# Patient Record
Sex: Female | Born: 1975 | Race: White | Hispanic: No | Marital: Single | Smoking: Current every day smoker
Health system: Southern US, Community
[De-identification: ages and names within clinical notes are randomized; demographics above are authoritative.]

## PROBLEM LIST (undated history)

## (undated) DIAGNOSIS — B977 Papillomavirus as the cause of diseases classified elsewhere: Secondary | ICD-10-CM

## (undated) DIAGNOSIS — C801 Malignant (primary) neoplasm, unspecified: Secondary | ICD-10-CM

## (undated) DIAGNOSIS — Z8614 Personal history of Methicillin resistant Staphylococcus aureus infection: Secondary | ICD-10-CM

## (undated) DIAGNOSIS — K635 Polyp of colon: Secondary | ICD-10-CM

## (undated) HISTORY — DX: Polyp of colon: K63.5

## (undated) HISTORY — DX: Papillomavirus as the cause of diseases classified elsewhere: B97.7

## (undated) HISTORY — DX: Personal history of Methicillin resistant Staphylococcus aureus infection: Z86.14

---

## 1995-12-26 DIAGNOSIS — C801 Malignant (primary) neoplasm, unspecified: Secondary | ICD-10-CM

## 1995-12-26 HISTORY — PX: BLADDER SURGERY: SHX569

## 1995-12-26 HISTORY — DX: Malignant (primary) neoplasm, unspecified: C80.1

## 1999-12-26 HISTORY — PX: RECTAL SURGERY: SHX760

## 1999-12-26 HISTORY — PX: ABDOMINAL HYSTERECTOMY: SHX81

## 2005-12-25 HISTORY — PX: GASTRIC BYPASS: SHX52

## 2007-12-26 DIAGNOSIS — K635 Polyp of colon: Secondary | ICD-10-CM

## 2007-12-26 HISTORY — PX: COLONOSCOPY: SHX174

## 2007-12-26 HISTORY — DX: Polyp of colon: K63.5

## 2008-05-04 ENCOUNTER — Emergency Department: Payer: Self-pay | Admitting: Emergency Medicine

## 2008-05-04 ENCOUNTER — Other Ambulatory Visit: Payer: Self-pay

## 2008-05-18 ENCOUNTER — Emergency Department: Payer: Self-pay | Admitting: Emergency Medicine

## 2008-12-22 ENCOUNTER — Emergency Department: Payer: Self-pay | Admitting: Emergency Medicine

## 2009-02-04 ENCOUNTER — Emergency Department: Payer: Self-pay | Admitting: Emergency Medicine

## 2009-11-23 ENCOUNTER — Emergency Department: Payer: Self-pay | Admitting: Internal Medicine

## 2010-02-04 ENCOUNTER — Emergency Department: Payer: Self-pay | Admitting: Emergency Medicine

## 2010-03-08 ENCOUNTER — Emergency Department: Payer: Self-pay | Admitting: Emergency Medicine

## 2010-07-20 ENCOUNTER — Emergency Department: Payer: Self-pay | Admitting: Emergency Medicine

## 2010-07-31 ENCOUNTER — Emergency Department: Payer: Self-pay | Admitting: Emergency Medicine

## 2010-08-11 ENCOUNTER — Emergency Department: Payer: Self-pay | Admitting: Internal Medicine

## 2010-08-14 ENCOUNTER — Inpatient Hospital Stay: Payer: Self-pay | Admitting: Internal Medicine

## 2011-03-09 ENCOUNTER — Emergency Department: Payer: Self-pay

## 2011-03-09 ENCOUNTER — Emergency Department: Payer: Self-pay | Admitting: Emergency Medicine

## 2011-03-10 ENCOUNTER — Emergency Department: Payer: Self-pay | Admitting: Emergency Medicine

## 2011-03-22 ENCOUNTER — Emergency Department: Payer: Self-pay | Admitting: Emergency Medicine

## 2011-09-03 ENCOUNTER — Emergency Department: Payer: Self-pay | Admitting: Emergency Medicine

## 2011-11-15 ENCOUNTER — Emergency Department: Payer: Self-pay | Admitting: Emergency Medicine

## 2012-04-04 ENCOUNTER — Emergency Department: Payer: Self-pay | Admitting: Internal Medicine

## 2012-07-01 ENCOUNTER — Emergency Department: Payer: Self-pay | Admitting: Emergency Medicine

## 2013-06-09 ENCOUNTER — Emergency Department: Payer: Self-pay | Admitting: Emergency Medicine

## 2014-11-07 ENCOUNTER — Emergency Department: Payer: Self-pay | Admitting: Emergency Medicine

## 2014-11-08 LAB — BASIC METABOLIC PANEL
Anion Gap: 9 (ref 7–16)
BUN: 4 mg/dL — ABNORMAL LOW (ref 7–18)
CO2: 26 mmol/L (ref 21–32)
Calcium, Total: 7.9 mg/dL — ABNORMAL LOW (ref 8.5–10.1)
Chloride: 108 mmol/L — ABNORMAL HIGH (ref 98–107)
Creatinine: 0.61 mg/dL (ref 0.60–1.30)
EGFR (African American): >60
Glucose: 84 mg/dL (ref 65–99)
OSMOLALITY: 281 (ref 275–301)
POTASSIUM: 3.5 mmol/L (ref 3.5–5.1)
SODIUM: 143 mmol/L (ref 136–145)

## 2015-01-26 ENCOUNTER — Emergency Department: Payer: Self-pay | Admitting: Emergency Medicine

## 2015-06-19 ENCOUNTER — Encounter: Payer: Self-pay | Admitting: Emergency Medicine

## 2015-06-19 ENCOUNTER — Emergency Department
Admission: EM | Admit: 2015-06-19 | Discharge: 2015-06-19 | Disposition: A | Discharge status: Home / Self Care | Payer: Self-pay | Attending: Emergency Medicine | Admitting: Emergency Medicine

## 2015-06-19 ENCOUNTER — Emergency Department: Payer: Self-pay

## 2015-06-19 DIAGNOSIS — S60211A Contusion of right wrist, initial encounter: Secondary | ICD-10-CM | POA: Insufficient documentation

## 2015-06-19 DIAGNOSIS — Z720 Tobacco use: Secondary | ICD-10-CM | POA: Insufficient documentation

## 2015-06-19 DIAGNOSIS — S5002XA Contusion of left elbow, initial encounter: Secondary | ICD-10-CM | POA: Insufficient documentation

## 2015-06-19 DIAGNOSIS — Y9389 Activity, other specified: Secondary | ICD-10-CM | POA: Insufficient documentation

## 2015-06-19 DIAGNOSIS — Y998 Other external cause status: Secondary | ICD-10-CM | POA: Insufficient documentation

## 2015-06-19 DIAGNOSIS — W01198A Fall on same level from slipping, tripping and stumbling with subsequent striking against other object, initial encounter: Secondary | ICD-10-CM | POA: Insufficient documentation

## 2015-06-19 DIAGNOSIS — Y9289 Other specified places as the place of occurrence of the external cause: Secondary | ICD-10-CM | POA: Insufficient documentation

## 2015-06-19 HISTORY — DX: Malignant (primary) neoplasm, unspecified: C80.1

## 2015-06-19 MED ORDER — IBUPROFEN 800 MG PO TABS: 800.0000 mg | ORAL_TABLET | Freq: Three times a day (TID) | ORAL | Status: DC | PRN

## 2015-06-19 MED ORDER — TRAMADOL HCL 50 MG PO TABS: 50.0000 mg | ORAL_TABLET | Freq: Three times a day (TID) | ORAL | Status: DC | PRN

## 2015-06-19 NOTE — ED Notes (Signed)
C/o right wrist pain x 1 week, denies any injury, pt smells of ETOH

## 2015-06-19 NOTE — Discharge Instructions (Signed)
Apply ice. Rest. Elevate. Take medication as prescribed as needed. Wear splint for support as long as pain continues.  Follow-up with orthopedic next week as needed for continued pain.  Return to the ER for new or worsening concerns.  Contusion A contusion is a deep bruise. Contusions are the result of an injury that caused bleeding under the skin. The contusion may turn blue, purple, or yellow. Minor injuries will give you a painless contusion, but more severe contusions may stay painful and swollen for a few weeks.  CAUSES  A contusion is usually caused by a blow, trauma, or direct force to an area of the body. SYMPTOMS   Swelling and redness of the injured area.  Bruising of the injured area.  Tenderness and soreness of the injured area.  Pain. DIAGNOSIS  The diagnosis can be made by taking a history and physical exam. An X-ray, CT scan, or MRI may be needed to determine if there were any associated injuries, such as fractures. TREATMENT  Specific treatment will depend on what area of the body was injured. In general, the best treatment for a contusion is resting, icing, elevating, and applying cold compresses to the injured area. Over-the-counter medicines may also be recommended for pain control. Ask your caregiver what the best treatment is for your contusion. HOME CARE INSTRUCTIONS   Put ice on the injured area.  Put ice in a plastic bag.  Place a towel between your skin and the bag.  Leave the ice on for 15-20 minutes, 3-4 times a day, or as directed by your health care provider.  Only take over-the-counter or prescription medicines for pain, discomfort, or fever as directed by your caregiver. Your caregiver may recommend avoiding anti-inflammatory medicines (aspirin, ibuprofen, and naproxen) for 48 hours because these medicines may increase bruising.  Rest the injured area.  If possible, elevate the injured area to reduce swelling. SEEK IMMEDIATE MEDICAL CARE IF:    You have increased bruising or swelling.  You have pain that is getting worse.  Your swelling or pain is not relieved with medicines. MAKE SURE YOU:   Understand these instructions.  Will watch your condition.  Will get help right away if you are not doing well or get worse. Document Released: 09/20/2005 Document Revised: 12/16/2013 Document Reviewed: 10/16/2011 [facility] Patient Information 2015 [place], [place]. This information is not intended to replace advice given to you by your health care provider. Make sure you discuss any questions you have with your health care provider.

## 2015-06-19 NOTE — ED Provider Notes (Signed)
[facility] Emergency Department Provider Note  ____________________________________________  Time seen: Approximately 11:12 AM  I have reviewed the triage vital signs and the nursing notes.   HISTORY  Chief Complaint Wrist Pain    HPI Carol Gallegos is a 39 y.o. female as the ER for complaints of right wrist pain. Patient states that this past week she was helping a friend moving air conditioner and states that she hit her right wrist on air conditioner. Patient states that she has had intermittent pain since. States pain to right wrist is aching pain at 5 out of 10 and worse with movement. Patient states it improves at rest.  Also reports left elbow pain. Patient states that 2 weeks ago she accidentally slipped and landed on her left elbow causing pain. Patient states that the pain is intermittent and describes current pain as 3 out of 10.Denies pain radiation. Denies head injury or LOC. Denies fever or other complaints.   Past Medical History  Diagnosis Date        There are no active problems to display for this patient.   Past Surgical History  Procedure Laterality Date  . Gastric bypass    . Bladder surgery      No current outpatient prescriptions on file.  Allergies Biaxin  No family history on file.  Social History History  Substance Use Topics  . Smoking status: Current Every Day Smoker  . Smokeless tobacco: Not on file  . Alcohol Use: Yes    Review of Systems Constitutional: No fever/chills Eyes: No visual changes. ENT: No sore throat. Cardiovascular: Denies chest pain. Respiratory: Denies shortness of breath. Gastrointestinal: No abdominal pain.  No nausea, no vomiting.  No diarrhea.  No constipation. Genitourinary: Negative for dysuria. Musculoskeletal: Negative for neck or back pain. Positive for right wrist and left elbow pain as above. Skin: Negative for rash. Neurological: Negative for headaches, focal weakness or  numbness.  10-point ROS otherwise negative.  ____________________________________________   PHYSICAL EXAM:  VITAL SIGNS: ED Triage Vitals  Enc Vitals Group     BP 06/19/15 1029 151/95 mmHg     Pulse Rate 06/19/15 1029 94     Resp 06/19/15 1029 18     Temp 06/19/15 1029 98.2 F (36.8 C)     Temp Source 06/19/15 1029 Oral     SpO2 06/19/15 1029 97 %     Weight 06/19/15 1029 185 lb (83.915 kg)     Height 06/19/15 1029 5\' 7"  (1.702 m)     Head Cir --      Peak Flow --      Pain Score 06/19/15 1029 9     Pain Loc --      Pain Edu? --      Excl. in [place]? --     Constitutional: Alert and oriented. Well appearing and in no acute distress. Eyes: Conjunctivae are normal. PERRL. EOMI. Head: Atraumatic. Nose: No congestion/rhinnorhea. Mouth/Throat: Mucous membranes are moist.  Oropharynx non-erythematous. Neck: No stridor.  No cervical spine tenderness to palpation. Hematological/Lymphatic/Immunilogical: No cervical lymphadenopathy. Cardiovascular: Normal rate, regular rhythm. Grossly normal heart sounds.  Good peripheral circulation. Respiratory: Normal respiratory effort.  No retractions. Lungs CTAB. Gastrointestinal: Soft and nontender. No distention.  No CVA tenderness. Musculoskeletal: No lower extremity tenderness nor edema.  No joint effusions.No cervical, thoracic or lumbar tenderness to palpation. Changes positions quickly and easily without difficulty or distress. Full range of motion to bilateral upper and lower extremities.  Right distal lateral wrist  mild tender to palpation, full range of motion. Mild pain present with wrist rotation. no swelling, ecchymosis or induration. Skin is intact.   left lateral elbow mild tender to palpation. Full range of motion. Pain is only present to palpation. No pain with range of motion. No swelling, ecchymosis, erythema. Skin intact.  Neurologic:  Normal speech and language. No gross focal neurologic deficits are appreciated. Speech is  normal. No gait instability. Skin:  Skin is warm, dry and intact. No rash noted. Psychiatric: Mood and affect are normal. Speech and behavior are normal.  __________________________________________  RADIOLOGY RIGHT WRIST - COMPLETE 3+ VIEW  COMPARISON: None.  FINDINGS: No acute fracture or dislocation is noted. Ulnar minus variant is seen. The carpal bones are within normal limits. Small well corticated bony densities are noted adjacent to the distal ulna which may be related to prior trauma.  IMPRESSION: Ulnar minus variant.  No acute abnormality noted.   Electronically Signed By: Inez Catalina M.D. On: 06/19/2015 11:55 LEFT ELBOW - COMPLETE 3+ VIEW  COMPARISON: 06/19/2015  FINDINGS: Normal alignment without acute fracture or definite effusion. Olecranon region soft tissue swelling posteriorly. Preserved joint spaces. No significant arthropathy.  IMPRESSION: Posterior elbow olecranon region soft tissue swelling.  No acute osseous finding.   Electronically Signed By: Jerilynn Mages. Shick M.D. On: 06/19/2015 11:57  ____________________________________________   PROCEDURES  Procedure(s) performed:  SPLINT APPLICATION Date/Time: 70:26 PM Authorized by: Marylene Land Consent: Verbal consent obtained. Risks and benefits: risks, benefits and alternatives were discussed Consent given by: patient Splint applied by: ed technician Location details: right wrist Splint type: right velcro cock up splint Post-procedure: The splinted body part was neurovascularly unchanged following the procedure. Patient tolerance: Patient tolerated the procedure well with no immediate complications.     ____________________________________________   INITIAL IMPRESSION / ASSESSMENT AND PLAN / ED COURSE  Pertinent labs & imaging results that were available during my care of the patient were reviewed by me and considered in my medical decision making (see chart for  details).  Very well-appearing patient. No acute distress presents the ER for complaint of right wrist and left elbow pain post recent injuries. No head injury or LOC. Steady gait. Right wrist x-ray positive for ulnar minus variant  But no acute abnormality noted. Left elbow full cranial region soft tissue swelling, no acute osseous finding.  Right wrist and left elbow contusion post injury. No fracture on x-rays. Apply ice rest and elevate. Splint to right wrist for support. Patient denies need for sling. Follow up with primary care physician or orthopedic as needed for continued pain. Discussed tramadol for pain as needed. ____________________________________________   FINAL CLINICAL IMPRESSION(S) / ED DIAGNOSES  Final diagnoses:  Elbow contusion, left, initial encounter  Wrist contusion, right, initial encounter      Marylene Land, NP 06/19/15 1231  Marylene Land, NP 06/19/15 1245  Orbie Pyo, MD 06/19/15 [id number]

## 2015-12-24 ENCOUNTER — Emergency Department
Admission: EM | Admit: 2015-12-24 | Discharge: 2015-12-25 | Disposition: A | Discharge status: Home / Self Care | Payer: Self-pay | Attending: Emergency Medicine | Admitting: Emergency Medicine

## 2015-12-24 DIAGNOSIS — F17200 Nicotine dependence, unspecified, uncomplicated: Secondary | ICD-10-CM | POA: Insufficient documentation

## 2015-12-24 DIAGNOSIS — J111 Influenza due to unidentified influenza virus with other respiratory manifestations: Secondary | ICD-10-CM | POA: Insufficient documentation

## 2015-12-24 LAB — CBC
HCT: 40.3 % (ref 35.0–47.0)
HEMOGLOBIN: 13.3 g/dL (ref 12.0–16.0)
MCH: 28.2 pg (ref 26.0–34.0)
MCHC: 33.1 g/dL (ref 32.0–36.0)
MCV: 85.1 fL (ref 80.0–100.0)
Platelets: 121 10*3/uL — ABNORMAL LOW (ref 150–440)
RBC: 4.73 MIL/uL (ref 3.80–5.20)
RDW: 18 % — ABNORMAL HIGH (ref 11.5–14.5)
WBC: 4 10*3/uL (ref 3.6–11.0)

## 2015-12-24 MED ORDER — SODIUM CHLORIDE 0.9 % IV SOLN
Freq: Once | INTRAVENOUS | Status: AC
  Administered 2015-12-24: via INTRAVENOUS

## 2015-12-24 MED ORDER — SODIUM CHLORIDE 0.9 % IV SOLN
Freq: Once | INTRAVENOUS | Status: AC
  Administered 2015-12-25: via INTRAVENOUS

## 2015-12-24 NOTE — ED Notes (Signed)
Pt reports 4 days of flu like symptoms.

## 2015-12-25 ENCOUNTER — Emergency Department: Payer: Self-pay

## 2015-12-25 ENCOUNTER — Other Ambulatory Visit: Payer: Self-pay

## 2015-12-25 LAB — COMPREHENSIVE METABOLIC PANEL
ALBUMIN: 4.1 g/dL (ref 3.5–5.0)
ALT: 15 U/L (ref 14–54)
AST: 26 U/L (ref 15–41)
Alkaline Phosphatase: 50 U/L (ref 38–126)
Anion gap: 11 (ref 5–15)
BUN: <5 mg/dL — ABNORMAL LOW (ref 6–20)
CHLORIDE: 100 mmol/L — AB (ref 101–111)
CO2: 23 mmol/L (ref 22–32)
Calcium: 9 mg/dL (ref 8.9–10.3)
Creatinine, Ser: 0.59 mg/dL (ref 0.44–1.00)
Glucose, Bld: 141 mg/dL — ABNORMAL HIGH (ref 65–99)
POTASSIUM: 3.4 mmol/L — AB (ref 3.5–5.1)
Sodium: 134 mmol/L — ABNORMAL LOW (ref 135–145)
Total Bilirubin: 0.6 mg/dL (ref 0.3–1.2)
Total Protein: 8 g/dL (ref 6.5–8.1)

## 2015-12-25 LAB — RAPID INFLUENZA A&B ANTIGENS
Influenza A (ARMC): NEGATIVE
Influenza B (ARMC): POSITIVE

## 2015-12-25 LAB — LACTIC ACID, PLASMA: Lactic Acid, Venous: 1.1 mmol/L (ref 0.5–2.0)

## 2015-12-25 MED ORDER — IPRATROPIUM-ALBUTEROL 0.5-2.5 (3) MG/3ML IN SOLN
3.0000 mL | Freq: Four times a day (QID) | RESPIRATORY_TRACT | Status: DC
  Administered 2015-12-25: 3 mL via RESPIRATORY_TRACT
  Filled 2015-12-25: qty 3

## 2015-12-25 MED ORDER — GUAIFENESIN-DM 100-10 MG/5ML PO SYRP: 5.0000 mL | ORAL_SOLUTION | ORAL | Status: DC | PRN

## 2015-12-25 MED ORDER — ACETAMINOPHEN 325 MG PO TABS: 650.0000 mg | ORAL_TABLET | Freq: Once | ORAL | Status: DC

## 2015-12-25 MED ORDER — ACETAMINOPHEN 500 MG PO TABS
1000.0000 mg | ORAL_TABLET | Freq: Once | ORAL | Status: AC
  Administered 2015-12-25: 1000 mg via ORAL
  Filled 2015-12-25: qty 2

## 2015-12-25 MED ORDER — BENZONATATE 100 MG PO CAPS
200.0000 mg | ORAL_CAPSULE | Freq: Once | ORAL | Status: AC
  Administered 2015-12-25: 200 mg via ORAL

## 2015-12-25 MED ORDER — IPRATROPIUM-ALBUTEROL 18-103 MCG/ACT IN AERO: 2.0000 | INHALATION_SPRAY | Freq: Four times a day (QID) | RESPIRATORY_TRACT | Status: DC

## 2015-12-25 MED ORDER — BENZONATATE 100 MG PO CAPS
ORAL_CAPSULE | ORAL | Status: AC
  Administered 2015-12-25: 200 mg via ORAL
  Filled 2015-12-25: qty 2

## 2015-12-25 MED ORDER — IBUPROFEN 800 MG PO TABS
800.0000 mg | ORAL_TABLET | Freq: Once | ORAL | Status: DC
  Filled 2015-12-25: qty 1

## 2015-12-25 MED ORDER — BENZONATATE 100 MG PO CAPS: 100.0000 mg | ORAL_CAPSULE | Freq: Four times a day (QID) | ORAL | Status: DC | PRN

## 2015-12-25 NOTE — ED Notes (Signed)
REviewed d/c instructions, prescriptions, and use of antipyretics. Pt verbalized understanding.

## 2015-12-25 NOTE — ED Notes (Signed)
MD Malinda at bedside. 

## 2015-12-25 NOTE — ED Notes (Signed)
Pt ambulated to bathroom independently at this time with no concerns. No acute distress noted.

## 2015-12-25 NOTE — ED Notes (Signed)
Pt given ice chips at this time, no acute distress noted.

## 2015-12-25 NOTE — Discharge Instructions (Signed)
Influenza, Adult Influenza ("the flu") is a viral infection of the respiratory tract. It occurs more often in winter months because people spend more time in close contact with one another. Influenza can make you feel very sick. Influenza easily spreads from person to person (contagious). CAUSES  Influenza is caused by a virus that infects the respiratory tract. You can catch the virus by breathing in droplets from an infected person's cough or sneeze. You can also catch the virus by touching something that was recently contaminated with the virus and then touching your mouth, nose, or eyes. RISKS AND COMPLICATIONS You may be at risk for a more severe case of influenza if you smoke cigarettes, have diabetes, have chronic heart disease (such as heart failure) or lung disease (such as asthma), or if you have a weakened immune system. Elderly people and pregnant women are also at risk for more serious infections. The most common problem of influenza is a lung infection (pneumonia). Sometimes, this problem can require emergency medical care and may be life threatening. SIGNS AND SYMPTOMS  Symptoms typically last 4 to 10 days and may include:  Fever.  Chills.  Headache, body aches, and muscle aches.  Sore throat.  Chest discomfort and cough.  Poor appetite.  Weakness or feeling tired.  Dizziness.  Nausea or vomiting. DIAGNOSIS  Diagnosis of influenza is often made based on your history and a physical exam. A nose or throat swab test can be done to confirm the diagnosis. TREATMENT  In mild cases, influenza goes away on its own. Treatment is directed at relieving symptoms. For more severe cases, your health care provider may prescribe antiviral medicines to shorten the sickness. Antibiotic medicines are not effective because the infection is caused by a virus, not by bacteria. HOME CARE INSTRUCTIONS  Take medicines only as directed by your health care provider.  Use a cool mist humidifier  to make breathing easier.  Get plenty of rest until your temperature returns to normal. This usually takes 3 to 4 days.  Drink enough fluid to keep your urine clear or pale yellow.  Cover yourmouth and nosewhen coughing or sneezing,and wash your handswellto prevent thevirusfrom spreading.  Stay homefromwork orschool untilthe fever is gonefor at least 68full day. PREVENTION  An annual influenza vaccination (flu shot) is the best way to avoid getting influenza. An annual flu shot is now routinely recommended for all adults in the [place] IF:  You experiencechest pain, yourcough worsens,or you producemore mucus.  Youhave nausea,vomiting, ordiarrhea.  Your fever returns or gets worse. SEEK IMMEDIATE MEDICAL CARE IF:  You havetrouble breathing, you become short of breath,or your skin ornails becomebluish.  You have severe painor stiffnessin the neck.  You develop a sudden headache, or pain in the face or ear.  You have nausea or vomiting that you cannot control. MAKE SURE YOU:   Understand these instructions.  Will watch your condition.  Will get help right away if you are not doing well or get worse.   This information is not intended to replace advice given to you by your health care provider. Make sure you discuss any questions you have with your health care provider.   Document Released: 12/08/2000 Document Revised: 01/01/2015 Document Reviewed: 03/11/2012 Elsevier Interactive Patient Education [employer].  Please return for higher fever or worse cough shortness of breath feeling sicker. Please use Tylenol for the fever. Please use the Combivent inhaler 2 puffs 4 times a day as needed. Please use  either the [employer] which she was swallow and not suck on or the Robitussin-DM for the cough. Please follow-up with your doctor in the next few days. Also please remember to return if you improve and then gets sicker again.  Sometimes she can get a pneumonia after having the flu. Make sure to drink plenty of fluids.

## 2015-12-25 NOTE — ED Notes (Signed)
Patient transported to X-ray 

## 2015-12-25 NOTE — ED Provider Notes (Signed)
[facility] Emergency Department Provider Note  ____________________________________________  Time seen: Approximately 5:16 AM  I have reviewed the triage vital signs and the nursing notes.   HISTORY  Chief Complaint Shortness of Breath    HPI Carol Gallegos is a 39 y.o. female patient complains of a bad cough going on for 4 days she is worse she has had a fever and felt hot and cold. She's had a runny nose. She has achy arms and legs. The cough is nonproductive. Everything kind of hurts everywhere. Pain is not severe however.   Past Medical History  Diagnosis Date  . Cancer     There are no active problems to display for this patient.   Past Surgical History  Procedure Laterality Date  . Gastric bypass    . Bladder surgery      Current Outpatient Rx  Name  Route  Sig  Dispense  Refill  . traMADol (ULTRAM) 50 MG tablet   Oral   Take 1 tablet (50 mg total) by mouth every 8 (eight) hours as needed (Do not drive or operate machinery while taking as can cause drowsiness.).   10 tablet   0     Allergies Biaxin and Motrin  No family history on file.  Social History Social History  Substance Use Topics  . Smoking status: Current Every Day Smoker  . Smokeless tobacco: Not on file  . Alcohol Use: Yes    Review of Systems Constitutiona fever/chills Eyes: No visual changes. ENT: No sore throat. Cardiovascular: Patient complains of some achiness in her chest with coughing Respiratory: Patient complains of some wheezing and slight shortness of breath. Gastrointestinal: No abdominal pain.  No nausea, no vomiting.  No diarrhea.  No constipation. Genitourinary: Negative for dysuria. Musculoskeletal: Negative for back pain. Skin: Negative for rash. Neurological: Negative for headaches, focal weakness or numbness.  10-point ROS otherwise negative.  ____________________________________________   PHYSICAL EXAM:  VITAL SIGNS: ED Triage  Vitals  Enc Vitals Group     BP 12/24/15 2324 185/114 mmHg     Pulse Rate 12/24/15 2324 124     Resp 12/24/15 2324 18     Temp 12/24/15 2324 100.2 F (37.9 C)     Temp Source 12/25/15 0400 Oral     SpO2 12/24/15 2324 95 %     Weight --      Height --      Head Cir --      Peak Flow --      Pain Score 12/25/15 0459 6     Pain Loc --      Pain Edu? --      Excl. in [place]? --     Constitutional: Alert and oriented. Well appearing and in no acute distress. Eyes: Conjunctivae are normal. PERRL. EOMI. Head: Atraumatic. Nose: No congestion/rhinnorhea. Mouth/Throat: Mucous membranes are moist.  Oropharynx non-erythematous. Neck: No stridor.   Cardiovascular: Normal rate, regular rhythm. Grossly normal heart sounds.  Good peripheral circulation. Respiratory: Normal respiratory effort.  No retractions. Lungs occasional scattered wheezes Gastrointestinal: Soft and nontender. No distention. No abdominal bruits. No CVA tenderness. Musculoskeletal: No lower extremity tenderness nor edema.  No joint effusions. Neurologic:  Normal speech and language. No gross focal neurologic deficits are appreciated. No gait instability. Skin:  Skin is warm, dry and intact. No rash noted. Psychiatric: Mood and affect are normal. Speech and behavior are normal.  ____________________________________________   LABS (all labs ordered are listed, but only abnormal results are  displayed)  Labs Reviewed  COMPREHENSIVE METABOLIC PANEL - Abnormal; Notable for the following:    Sodium 134 (*)    Potassium 3.4 (*)    Chloride 100 (*)    Glucose, Bld 141 (*)    BUN <5 (*)    All other components within normal limits  CBC - Abnormal; Notable for the following:    RDW 18.0 (*)    Platelets 121 (*)    All other components within normal limits  RAPID INFLUENZA A&B ANTIGENS ([facility] ONLY)  CULTURE, BLOOD (ROUTINE X 2)  CULTURE, BLOOD (ROUTINE X 2)  LACTIC ACID, PLASMA  URINALYSIS COMPLETEWITH MICROSCOPIC ([facility]  ONLY)   ____________________________________________  EKG  EKG read and interpreted by me as sinus tach at a rate of 114 normal axis and normal ST-T waves ____________________________________________  RADIOLOGY  Chest x-ray read by radiology as negative ____________________________________________   PROCEDURES    ____________________________________________   INITIAL IMPRESSION / ASSESSMENT AND PLAN / ED COURSE  Pertinent labs & imaging results that were available during my care of the patient were reviewed by me and considered in my medical decision making (see chart for details).   ____________________________________________   FINAL CLINICAL IMPRESSION(S) / ED DIAGNOSES  Final diagnoses:  Flu      Nena Polio, MD 12/25/15 [id number]

## 2015-12-30 LAB — CULTURE, BLOOD (ROUTINE X 2)
CULTURE: NO GROWTH
Culture: NO GROWTH

## 2016-07-25 HISTORY — PX: BREAST CYST EXCISION: SHX579

## 2016-08-02 ENCOUNTER — Ambulatory Visit: Payer: Self-pay | Attending: Internal Medicine | Admitting: *Deleted

## 2016-08-02 ENCOUNTER — Other Ambulatory Visit: Payer: Self-pay | Admitting: *Deleted

## 2016-08-02 ENCOUNTER — Ambulatory Visit
Admission: RE | Admit: 2016-08-02 | Discharge: 2016-08-02 | Disposition: A | Discharge status: Home / Self Care | Payer: Self-pay | Source: Ambulatory Visit | Attending: Oncology | Admitting: Oncology

## 2016-08-02 ENCOUNTER — Encounter: Payer: Self-pay | Admitting: *Deleted

## 2016-08-02 VITALS — BP 153/98 | HR 94 | Temp 98.5°F | Ht 65.75 in | Wt 223.5 lb

## 2016-08-02 DIAGNOSIS — N63 Unspecified lump in unspecified breast: Secondary | ICD-10-CM

## 2016-08-02 DIAGNOSIS — Z0000 Encounter for general adult medical examination without abnormal findings: Secondary | ICD-10-CM

## 2016-08-02 NOTE — Patient Instructions (Signed)
Human Papillomavirus Human papillomavirus (HPV) is the most common sexually transmitted infection (STI) and is highly contagious. HPV infections cause genital warts and cancers to the outlet of the womb (cervix), birth canal (vagina), opening of the birth canal (vulva), and anus. There are over 100 types of HPV. Unless wartlike lesions are present in the throat or there are genital warts that you can see or feel, HPV usually does not cause symptoms. It is possible to be infected for long periods and pass it on to others without knowing it. CAUSES  HPV is spread from person to person through sexual contact. This includes oral, vaginal, or anal sex. RISK FACTORS  Having unprotected sex. HPV can be spread by oral, vaginal, or anal sex.  Having several sex partners.  Having a sex partner who has other sex partners.  Having or having had another sexually transmitted infection. SIGNS AND SYMPTOMS  Most people carrying HPV do not have any symptoms. If symptoms are present, symptoms may include:  Wartlike lesions in the throat (from having oral sex).  Warts in the infected skin or mucous membranes.  Genital warts that may itch, burn, or bleed.  Genital warts that may be painful or bleed during sexual intercourse. DIAGNOSIS  If wartlike lesions are present in the throat or genital warts are present, your health care provider can usually diagnose HPV by physical examination.   Genital warts are easily seen with the naked eye.  Currently, there is no FDA-approved test to detect HPV in males.  In females, a Pap test can show cells that are infected with HPV.  In females, a scope can be used to view the cervix (colposcopy). A colposcopy can be performed if the pelvic exam or Pap test is abnormal. A sample of tissue may be removed (biopsy) during the colposcopy. TREATMENT  There is no treatment for the virus itself. However, there are treatments for the health problems and symptoms HPV can cause.  Your health care provider will follow you closely after you are treated. This is because the HPV can come back and may need treatment again. Treatment of HPV may include:   Medicines, which may be injected or applied in a cream, lotion, or gel form.  Use of a probe to apply extreme cold (cryotherapy).  Application of an intense beam of light (laser treatment).  Use of a probe to apply extreme heat (electrocautery).  Surgery. HOME CARE INSTRUCTIONS   Take medicines only as directed by your health care provider.  Use over-the-counter creams for itching or irritation as directed by your health care provider.  Keep all follow-up visits as directed by your health care provider. This is important.  Do not touch or scratch the warts.  Do not treat genital warts with medicines used for treating hand warts.  Do not have sex while you are being treated.  Do not douche or use tampons during treatment of HPV.  Tell your sex partner about your infection because he or she may also need treatment.  If you become pregnant, tell your health care provider that you have had HPV. Your health care provider will monitor you closely during pregnancy to be sure your baby is safe.  After treatment, use condoms during sex to prevent future infections.  Have only one sex partner.  Have a sex partner who does not have other sex partners. PREVENTION   Talk to your health care provider about getting the HPV vaccines. These vaccines prevent some HPV infections and cancers.  It is recommended that the vaccine be given to males and females between the ages of 20 and 62 years old. It will not work if you already have HPV, and it is not recommended for pregnant women.  A Pap test is done to screen for cervical cancer in women.  The first Pap test should be done at age 46 years.  Between ages 48 and 57 years, Pap tests are repeated every 2 years.  Beginning at age 46, you are advised to have a Pap test every  3 years as long as your past 3 Pap tests have been normal.  Some women have medical problems that increase the chance of getting cervical cancer. Talk to your health care provider about these problems. It is especially important to talk to your health care provider if a new problem develops soon after your last Pap test. In these cases, your health care provider may recommend more frequent screening and Pap tests.  The above recommendations are the same for women who have or have not gotten the vaccine for HPV.  If you had a hysterectomy for a problem that was not a cancer or a condition that could lead to cancer, then you no longer need Pap tests. However, even if you no longer need a Pap test, a regular exam is a good idea to make sure no other problems are starting.   If you are between the ages of 89 and 45 years and you have had normal Pap tests going back 10 years, you no longer need Pap tests. However, even if you no longer need a Pap test, a regular exam is a good idea to make sure no other problems are starting.  If you have had past treatment for cervical cancer or a condition that could lead to cancer, you need Pap tests and screening for cancer for at least 20 years after your treatment.  If Pap tests have been discontinued, risk factors (such as a new sexual partner)need to be reassessed to determine if screening should be resumed.  Some women may need screenings more often if they are at high risk for cervical cancer. SEEK MEDICAL CARE IF:   The treated skin becomes red, swollen, or painful.  You have a fever.  You feel generally ill.  You feel lumps or pimple-like projections in and around your genital area.  You develop bleeding of the vagina or the treatment area.  You have painful sexual intercourse. MAKE SURE YOU:   Understand these instructions.  Will watch your condition.  Will get help if you are not doing well or get worse.   This information is not  intended to replace advice given to you by your health care provider. Make sure you discuss any questions you have with your health care provider.   Document Released: 03/02/2004 Document Revised: 01/01/2015 Document Reviewed: 03/18/2014 Elsevier Interactive Patient Education 2016 [employer] patient hand-out, Women Staying Healthy, Active and Well from [employer], with education on breast health, pap smears, heart and colon health.

## 2016-08-02 NOTE — Progress Notes (Signed)
Subjective:     Patient ID: Carol Gallegos, female   DOB: 03-30-1976, 40 y.o.   MRN: [id number]  HPI   Review of Systems     Objective:   Physical Exam  Pulmonary/Chest: Right breast exhibits mass. Right breast exhibits no inverted nipple, no nipple discharge, no skin change and no tenderness. Left breast exhibits inverted nipple. Left breast exhibits no mass, no nipple discharge, no skin change and no tenderness. Breasts are symmetrical.    Abdominal: There is no splenomegaly or hepatomegaly.    Genitourinary: There is no rash, tenderness, lesion or injury on the right labia. There is no rash, tenderness, lesion or injury on the left labia. Right adnexum displays no mass, no tenderness and no fullness. Left adnexum displays no mass, no tenderness and no fullness. No erythema, tenderness or bleeding in the vagina. No foreign body in the vagina. No signs of injury around the vagina. No vaginal discharge found.  Genitourinary Comments: hysterectomy       Assessment:     40 year old White female presents to [facility] with complaints of a right breast "cyst".  States she found a nodule in her right breast a few weeks ago.  States she expressed some "white thick" discharge from it after using a warm compress.  States the area got better, but has returned again.  States history of cervical cancer at age 48 and treated with a hysterectomy at that time.  Also has a history of gastric bypass surgery.  On clinical breast exam I can see a red raised montgomery gland on visual inspection.  The breast are large and pendulous.  I can palpate an approximate 1.5 cm nodule under the red raised montgomery gland. Suspect an inflamed montgomery gland.  Taught self breast awareness.  Specimen collected for pap smear since patient has a history of cervical cancer.  Blood pressure elevated at 153/98.  She is to recheck her blood pressure at [facility] or [facility], and if remains higher than 140/90 she is to follow-up with  her primary care provider.  Hand out on hypertention given to patient.  Patient has other complaints of bony pain and deformity in her fingers, and crepitus in her knees.  Jeanella Anton to schedule patient for primary care consult.  Patient has been screened for eligibility.  She does not have any insurance, Medicare or Medicaid.  She also meets financial eligibility.  Hand-out given on the Affordable Care Act.    Plan:     Bilateral diagnostic mammogram with ultrasound ordered.  Pap specimen sent to the lab.  Will follow-up per [facility] protocol.

## 2016-08-03 ENCOUNTER — Telehealth: Payer: Self-pay | Admitting: *Deleted

## 2016-08-03 ENCOUNTER — Encounter: Payer: Self-pay | Admitting: General Surgery

## 2016-08-03 ENCOUNTER — Encounter: Payer: Self-pay | Admitting: *Deleted

## 2016-08-03 NOTE — Telephone Encounter (Signed)
Called patient and reviewed the results of her mammogram with her and need for surgical consult.  She is scheduled to see Dr. Jamal Collin on 08/17/16 @ 11:00.  Will schedule patient for her six month follow-up mammogram after her surgical consult.

## 2016-08-04 LAB — PAP LB AND HPV HIGH-RISK
HPV, HIGH-RISK: NEGATIVE
PAP Smear Comment: 0

## 2016-08-17 ENCOUNTER — Encounter: Payer: Self-pay | Admitting: General Surgery

## 2016-08-17 ENCOUNTER — Ambulatory Visit (INDEPENDENT_AMBULATORY_CARE_PROVIDER_SITE_OTHER): Payer: PRIVATE HEALTH INSURANCE | Admitting: General Surgery

## 2016-08-17 VITALS — BP 142/86 | HR 76 | Resp 16 | Ht 65.0 in | Wt 225.0 lb

## 2016-08-17 DIAGNOSIS — N6001 Solitary cyst of right breast: Secondary | ICD-10-CM | POA: Diagnosis not present

## 2016-08-17 NOTE — Patient Instructions (Signed)
The patient is aware to call back for any questions or concerns.  

## 2016-08-17 NOTE — Progress Notes (Signed)
Patient ID: Carol Gallegos, female   DOB: [birth year], 40 y.o.   MRN: [id number]  Chief Complaint  Patient presents with  . Breast Problem    HPI Carol Gallegos is a 40 y.o. female.  who presents for a breast evaluation. The most recent and first mammogram was done on 08-02-16 with right breast ultrasound.  Patient does perform regular self breast checks.   She states she had milky nipple discharge about 1-2 months ago. She denies pain but does state it itches. She states she feels a small knot about nipple. She has gained some of her weight back since the gastric bypass surgery. She is originally from [place]. She has an appointment with [facility] for her PCP. I have reviewed the history of present illness with the patient.  HPI  Past Medical History:  Diagnosis Date  . Cancer ([place]) 1997   uterine  . Colon polyp 2009  . History of methicillin resistant staphylococcus aureus (MRSA)   . HPV in female     Past Surgical History:  Procedure Laterality Date  . ABDOMINAL HYSTERECTOMY  2001  . BLADDER SURGERY  1997  . COLONOSCOPY  2009  . GASTRIC BYPASS  2007  . RECTAL SURGERY  2001    Family History  Problem Relation Age of Onset  . Diabetes Mother   . Lung cancer Father   . Colon cancer Maternal Uncle   . Breast cancer Neg Hx     Social History Social History  Substance Use Topics  . Smoking status: Current Every Day Smoker    Packs/day: 0.50    Types: Cigarettes  . Smokeless tobacco: Never Used  . Alcohol use Yes    Allergies  Allergen Reactions  . Biaxin [Clarithromycin] Other (See Comments)    thrush  . Morphine   . Motrin [Ibuprofen]     Gastric Bypass Surgery    No current outpatient prescriptions on file.   No current facility-administered medications for this visit.     Review of Systems Review of Systems  Constitutional: Negative.   Respiratory: Negative.   Cardiovascular: Negative.     Blood pressure (!) 142/86, pulse 76, resp. rate 16,  height 5\' 5"  (1.651 m), weight 225 lb (102.1 kg).  Physical Exam Physical Exam  Constitutional: She is oriented to person, place, and time. She appears well-developed and well-nourished.  HENT:  Mouth/Throat: Oropharynx is clear and moist.  Eyes: Conjunctivae are normal. No scleral icterus.  Neck: Neck supple.  Cardiovascular: Normal rate, regular rhythm and normal heart sounds.   Pulmonary/Chest: Effort normal. She has wheezes. Right breast exhibits mass. Right breast exhibits no inverted nipple, no nipple discharge, no skin change and no tenderness. Left breast exhibits no inverted nipple, no mass, no nipple discharge, no skin change and no tenderness.  1.5 cm cystic feeling mass at 2 o'clock subareolar right breast.  Lymphadenopathy:    She has no cervical adenopathy.  Neurological: She is alert and oriented to person, place, and time.  Skin: Skin is warm and dry.  Psychiatric: Her behavior is normal.    Data Reviewed Progress notes, mammogram and breast ultrasound.  Assessment    Right breast cystic mass- likely inflamed skin cyst. Best this is excised before it poses problem of recurring infection. History colon polyps-no details other than the fact she has not had a colonoscopy in a while    Plan    Recommend excision of the right breast mass in the office at her convenience, patient agrees.  Recommend she seek further follow up regarding colon polyps     This information has been scribed by Karie Fetch RN, BSN,BC.   Brentlee Sciara G 08/18/2016, 10:10 AM

## 2016-08-18 ENCOUNTER — Encounter: Payer: Self-pay | Admitting: General Surgery

## 2016-08-24 ENCOUNTER — Encounter: Payer: Self-pay | Admitting: General Surgery

## 2016-08-24 ENCOUNTER — Ambulatory Visit (INDEPENDENT_AMBULATORY_CARE_PROVIDER_SITE_OTHER): Payer: PRIVATE HEALTH INSURANCE | Admitting: General Surgery

## 2016-08-24 VITALS — BP 138/82 | HR 86 | Resp 12 | Ht 65.0 in | Wt 228.0 lb

## 2016-08-24 DIAGNOSIS — N6001 Solitary cyst of right breast: Secondary | ICD-10-CM | POA: Diagnosis not present

## 2016-08-24 NOTE — Patient Instructions (Addendum)
The patient is aware to call back for any questions or concerns.    CARE AFTER BREAST excision  1. Leave the dressing on that your doctor applied after the biopsy. It is waterproof. You may bathe, shower and/or swim. The dressing can be removed in 3 days, you will see small strips of tape against your skin on the incision. Do not remove these strips they will gradually fall off in about 2-3 weeks. You may use an ice pack on and off for the first 12-24 hours for comfort.  2. You may want to use a gauze,cloth or similar protection in your bra to prevent rubbing against your dressing and incision. This is not necessary, but you may feel more comfortable doing so.  3. It is recommended that you wear a bra day and night to give support to the breast. This will prevent the weight of the breast from pulling on the incision.  4. Your breast may feel hard and lumpy under the incision. Do not be alarmed. This is the underlying stitching of tissue. Softening of this tissue will occur in time.  5. You may have a follow up appointment or phone follow up in one week after your biopsy. The office phone number is [phone].  6. You will notice about a week or two after your office visit that the strips of the tape on your incision will begin to loosen. These may then be removed.  7. Report to your doctor any of the following:  * Severe pain not relieved by your pain medication  *Redness of the incision  * Drainage from the incision  *Fever greater than 101 degrees

## 2016-08-24 NOTE — Progress Notes (Signed)
Patient ID: Carol Gallegos, female   DOB: Jul 29, 1976, 40 y.o.   MRN: [id number]  Chief Complaint  Patient presents with  . Procedure    right breast cystr excision    HPI Carol Gallegos is a 40 y.o. female.  Here today for excision right breast cyst. Says the cyst is smaller today. I have reviewed the history of present illness with the patient.   HPI  Past Medical History:  Diagnosis Date  . Cancer ([place]) 1997   uterine  . Colon polyp 2009  . History of methicillin resistant staphylococcus aureus (MRSA)   . HPV in female     Past Surgical History:  Procedure Laterality Date  . ABDOMINAL HYSTERECTOMY  2001  . BLADDER SURGERY  1997  . COLONOSCOPY  2009  . GASTRIC BYPASS  2007  . RECTAL SURGERY  2001    Family History  Problem Relation Age of Onset  . Diabetes Mother   . Lung cancer Father   . Colon cancer Maternal Uncle   . Breast cancer Neg Hx     Social History Social History  Substance Use Topics  . Smoking status: Current Every Day Smoker    Packs/day: 0.50    Types: Cigarettes  . Smokeless tobacco: Never Used  . Alcohol use Yes    Allergies  Allergen Reactions  . Biaxin [Clarithromycin] Other (See Comments)    thrush  . Morphine   . Motrin [Ibuprofen]     Gastric Bypass Surgery    No current outpatient prescriptions on file.   No current facility-administered medications for this visit.     Review of Systems Review of Systems  Constitutional: Negative.   Respiratory: Negative.   Cardiovascular: Negative.     Blood pressure 138/82, pulse 86, resp. rate 12, height 5\' 5"  (1.651 m), weight 228 lb (103.4 kg).  Physical Exam Physical Exam  Constitutional: She is oriented to person, place, and time. She appears well-developed and well-nourished.  Neurological: She is alert and oriented to person, place, and time.  Skin: Skin is warm and dry.  Psychiatric: Her behavior is normal.   The cystic mass right breast is less than 1 cm today.  Located UIQ subareolar Data Reviewed  Prior notes.  Assessment    Breast cyst    Plan Procedure: Excision cystic mass right areolar region upper inner quadrant  Anesthetic: 8 mL of 0.5% Marcaine mixed with 1% Xylocaine  Prep: ChloraPrep  Description: After local anesthetic and prepped the area was draped out with sterile towels. A radially oriented elliptical incision was made around the cystic mass. This overlying skin and the underlying cystic mass was completely excised out. Bleeding controlled with cautery 3 interrupted stitches were placed in the subcutaneous tissue of 3-0 Vicryl. Skin closed with subcuticular 4-0 Vicryl. Tincture benzoin Steri-Strips applied. Dressed with Telfa and Tegaderm. No immediate problems from the procedure. Patient advised on wound care.     \Follow  Up 45month    This information has been scribed by Karie Fetch RN, BSN,BC.   Zeynep Fantroy G 08/24/2016, 11:32 AM

## 2016-08-29 ENCOUNTER — Telehealth: Payer: Self-pay

## 2016-08-29 NOTE — Telephone Encounter (Signed)
Patient called concerned about her dressing from an excision done in office on 08/24/16. She states that she did remove the waterproof dressing as instructed. She states that one of the steri strips is starting to peel up at one end. She was not sure if this was OK or not. I let her know that that this was fine. She denies any drainage, tenderness, or redness. She was asking about her pathology and I let her know that it was not back yet but would let Dr Jamal Collin know she was looking for it.

## 2016-08-30 LAB — PATHOLOGY

## 2016-08-31 ENCOUNTER — Telehealth: Payer: Self-pay | Admitting: *Deleted

## 2016-08-31 ENCOUNTER — Encounter: Payer: Self-pay | Admitting: *Deleted

## 2016-08-31 NOTE — Telephone Encounter (Signed)
Notified patient as instructed, patient pleased. Discussed follow-up appointments, patient agrees  

## 2016-08-31 NOTE — Progress Notes (Signed)
Discussed patient with Dr. Jamal Collin last week, and he would like to get notes from her previous provider regarding her history of polyps dating back to age 40.  Talked to patient yesterday and she gave verbal consent for release of information from Dr. Ihor Dow.  I faxed consent for release of information today to his office.  Will forward it on to Dr. Jamal Collin when received.

## 2016-08-31 NOTE — Telephone Encounter (Signed)
-----   Message from Christene Lye, MD sent at 08/30/2016  5:01 PM EDT ----- Please let pt pt know the pathology was normal.

## 2016-09-14 ENCOUNTER — Telehealth: Payer: Self-pay | Admitting: *Deleted

## 2016-09-14 NOTE — Telephone Encounter (Signed)
I talked with the patient she states that yesterday after working in the yard and sweating she noticed an tiny amount of drainage. She talked with Dr Jamal Collin yesterday. She states no drainage today, no redness, fevers or swelling. She will keep the area clean. She will monitor symptoms and call for changes.

## 2016-09-20 ENCOUNTER — Ambulatory Visit (INDEPENDENT_AMBULATORY_CARE_PROVIDER_SITE_OTHER): Payer: PRIVATE HEALTH INSURANCE | Admitting: General Surgery

## 2016-09-20 ENCOUNTER — Encounter: Payer: Self-pay | Admitting: General Surgery

## 2016-09-20 VITALS — BP 154/90 | HR 90 | Resp 16 | Ht 65.0 in | Wt 230.0 lb

## 2016-09-20 DIAGNOSIS — N6001 Solitary cyst of right breast: Secondary | ICD-10-CM

## 2016-09-20 MED ORDER — DOXYCYCLINE HYCLATE 100 MG PO CAPS: 100.0000 mg | ORAL_CAPSULE | Freq: Two times a day (BID) | ORAL | 0 refills | Status: DC

## 2016-09-20 NOTE — Progress Notes (Signed)
Patient ID: Carol Gallegos, female   DOB: Jan 11, 1976, 40 y.o.   MRN: [id number]  Chief Complaint  Patient presents with  . Routine Post Op    HPI Carol Gallegos is a 40 y.o. female here today for post procedure visit, excision right breast skin cyst. She states she is doing well. She states she has seen some drainage over the last few days.  I have reviewed the history of present illness with the patient.  HPI  Past Medical History:  Diagnosis Date  . Cancer ([place]) 1997   uterine  . Colon polyp 2009  . History of methicillin resistant staphylococcus aureus (MRSA)   . HPV in female     Past Surgical History:  Procedure Laterality Date  . ABDOMINAL HYSTERECTOMY  2001  . BLADDER SURGERY  1997  . COLONOSCOPY  2009  . GASTRIC BYPASS  2007  . RECTAL SURGERY  2001    Family History  Problem Relation Age of Onset  . Diabetes Mother   . Lung cancer Father   . Colon cancer Maternal Uncle   . Breast cancer Neg Hx     Social History Social History  Substance Use Topics  . Smoking status: Current Every Day Smoker    Packs/day: 0.50    Types: Cigarettes  . Smokeless tobacco: Never Used  . Alcohol use Yes    Allergies  Allergen Reactions  . Biaxin [Clarithromycin] Other (See Comments)    thrush  . Morphine   . Motrin [Ibuprofen]     Gastric Bypass Surgery    Current Outpatient Prescriptions  Medication Sig Dispense Refill  . doxycycline (VIBRAMYCIN) 100 MG capsule Take 1 capsule (100 mg total) by mouth 2 (two) times daily. 14 capsule 0   No current facility-administered medications for this visit.     Review of Systems Review of Systems  Constitutional: Negative.   Respiratory: Negative.   Cardiovascular: Negative.     Blood pressure (!) 154/90, pulse 90, resp. rate 16, height 5\' 5"  (1.651 m), weight 230 lb (104.3 kg).  Physical Exam Physical Exam  Constitutional: She is oriented to person, place, and time. She appears well-developed and well-nourished.   Pulmonary/Chest:    No signs of redness or infection, culture obtained.  Neurological: She is alert and oriented to person, place, and time.  Skin: Skin is warm and dry.  Psychiatric: Her behavior is normal.    Data Reviewed Path revealed benign pilar cyst Prior notes.  Assessment    Pilar cyst post excision, small amount of pus at the incision, will Rx with Doxycycline 100 mg BID for 1 week.    Plan       Doxycycline 100 mg BID # 14.  Follow up as needed.  This information has been scribed by Karie Fetch RN, BSN,BC.   Gumecindo Hopkin G 09/20/2016, 11:16 AM

## 2016-09-20 NOTE — Patient Instructions (Signed)
The patient is aware to call back for any questions or concerns.  

## 2016-09-25 LAB — ANAEROBIC AND AEROBIC CULTURE

## 2016-09-28 ENCOUNTER — Telehealth: Payer: Self-pay | Admitting: *Deleted

## 2016-09-28 NOTE — Telephone Encounter (Signed)
-----   Message from Christene Lye, MD sent at 09/28/2016  9:02 AM EDT ----- Doxycycline should cover this. Plase let pt know

## 2016-09-28 NOTE — Telephone Encounter (Signed)
Notified patient as instructed, patient pleased. Discussed follow-up appointments, patient agrees  

## 2016-10-09 ENCOUNTER — Other Ambulatory Visit: Payer: Self-pay | Admitting: *Deleted

## 2016-10-09 ENCOUNTER — Encounter: Payer: Self-pay | Admitting: *Deleted

## 2016-10-09 DIAGNOSIS — N63 Unspecified lump in unspecified breast: Secondary | ICD-10-CM

## 2016-10-09 NOTE — Progress Notes (Signed)
Patient had a birads 3 mammogram with benign biopsy by Dr. Jamal Collin.  Letter mailed to mailed with appointment for a six month follow-up uni right mammogram and ultrasound on 02/06/17 @ 1:40.

## 2017-02-06 ENCOUNTER — Ambulatory Visit: Payer: Self-pay

## 2017-03-09 ENCOUNTER — Other Ambulatory Visit: Payer: Self-pay

## 2017-03-19 ENCOUNTER — Ambulatory Visit: Payer: Self-pay

## 2017-05-15 ENCOUNTER — Ambulatory Visit (INDEPENDENT_AMBULATORY_CARE_PROVIDER_SITE_OTHER): Payer: PRIVATE HEALTH INSURANCE | Admitting: General Surgery

## 2017-05-15 ENCOUNTER — Encounter: Payer: Self-pay | Admitting: General Surgery

## 2017-05-15 VITALS — BP 152/84 | HR 113 | Resp 16 | Ht 62.0 in | Wt 226.0 lb

## 2017-05-15 DIAGNOSIS — N61 Mastitis without abscess: Secondary | ICD-10-CM

## 2017-05-15 MED ORDER — DOXYCYCLINE HYCLATE 100 MG PO TABS: 100.0000 mg | ORAL_TABLET | Freq: Two times a day (BID) | ORAL | 0 refills | Status: DC

## 2017-05-15 NOTE — Progress Notes (Signed)
Patient ID: Carol Gallegos, female   DOB: 1976/02/09, 41 y.o.   MRN: [id number]  Chief Complaint  Patient presents with  . Follow-up    right breast infection    HPI Carol Gallegos is a 41 y.o. female here today for her follow up right breast infection. Patient states the area  Is red and swollen. She noticed this about three days ago. HPI  Past Medical History:  Diagnosis Date  . Cancer ([place]) 1997   uterine  . Colon polyp 2009  . History of methicillin resistant staphylococcus aureus (MRSA)   . HPV in female     Past Surgical History:  Procedure Laterality Date  . ABDOMINAL HYSTERECTOMY  2001  . BLADDER SURGERY  1997  . COLONOSCOPY  2009  . GASTRIC BYPASS  2007  . RECTAL SURGERY  2001    Family History  Problem Relation Age of Onset  . Diabetes Mother   . Lung cancer Father   . Colon cancer Maternal Uncle   . Breast cancer Neg Hx     Social History Social History  Substance Use Topics  . Smoking status: Current Every Day Smoker    Packs/day: 0.50    Types: Cigarettes  . Smokeless tobacco: Never Used  . Alcohol use Yes    Allergies  Allergen Reactions  . Biaxin [Clarithromycin] Other (See Comments)    thrush  . Morphine   . Motrin [Ibuprofen]     Gastric Bypass Surgery    Current Outpatient Prescriptions  Medication Sig Dispense Refill  . lisinopril (PRINIVIL,ZESTRIL) 10 MG tablet Take 10 mg by mouth daily.    Marland Kitchen doxycycline (VIBRA-TABS) 100 MG tablet Take 1 tablet (100 mg total) by mouth 2 (two) times daily. 14 tablet 0   No current facility-administered medications for this visit.     Review of Systems Review of Systems  Constitutional: Negative.   Respiratory: Negative.   Cardiovascular: Negative.     Blood pressure (!) 152/84, pulse (!) 113, resp. rate 16, height 5\' 2"  (1.575 m), weight 226 lb (102.5 kg).  Physical Exam Physical Exam  Constitutional: She appears well-developed and well-nourished.  Eyes: Conjunctivae are normal.   Cardiovascular: Normal rate and regular rhythm.   Pulmonary/Chest: Right breast exhibits skin change and tenderness. Right breast exhibits no mass and no nipple discharge.    Lymphadenopathy:    She has no axillary adenopathy.    Data Reviewed Prior notes reviewed   Assessment    Right nipple cyst. Drained Rx sent for doxycycline 100mg  bid for 1 week    Plan    Patient to return in two weeks.     HPI, Physical Exam, Assessment and Plan have been scribed under the direction and in the presence of Mckinley Jewel, MD  Carol Gallegos, CMA  I have completed the exam and reviewed the above documentation for accuracy and completeness.  I agree with the above.  Haematologist has been used and any errors in dictation or transcription are unintentional.  Bevin Das G. Jamal Collin, M.D., F.A.C.S.   Junie Panning G 05/17/2017, 8:57 AM

## 2017-05-15 NOTE — Patient Instructions (Signed)
Return in two weeks.  

## 2017-05-30 ENCOUNTER — Ambulatory Visit (INDEPENDENT_AMBULATORY_CARE_PROVIDER_SITE_OTHER): Payer: PRIVATE HEALTH INSURANCE | Admitting: General Surgery

## 2017-05-30 ENCOUNTER — Encounter: Payer: Self-pay | Admitting: General Surgery

## 2017-05-30 VITALS — BP 156/86 | HR 106 | Resp 16 | Ht 67.0 in | Wt 221.0 lb

## 2017-05-30 DIAGNOSIS — N61 Mastitis without abscess: Secondary | ICD-10-CM

## 2017-05-30 NOTE — Patient Instructions (Signed)
Return as needed

## 2017-05-30 NOTE — Progress Notes (Signed)
Patient ID: Carol Gallegos, female   DOB: [birth year], 41 y.o.   MRN: [id number]  Chief Complaint  Patient presents with  . Follow-up    HPI Carol Gallegos is a 41 y.o. female.  Her for follow up right nipple infection. She states it has cleared up no drainage. She still one more day of ATB left to take. She is coughing from an URI.  HPI  Past Medical History:  Diagnosis Date  . 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 Colon polyp 2009  . History of methicillin resistant staphylococcus aureus (MRSA)   . HPV in female     Past Surgical History:  Procedure Laterality Date  . ABDOMINAL HYSTERECTOMY  2001  . BLADDER SURGERY  1997  . COLONOSCOPY  2009  . GASTRIC BYPASS  2007  . RECTAL SURGERY  2001    Family History  Problem Relation Age of Onset  . Diabetes Mother   . Lung cancer Father   . Colon cancer Maternal Uncle   . Breast cancer Neg Hx     Social History Social History  Substance Use Topics  . Smoking status: Current Every Day Smoker    Packs/day: 0.50    Types: Cigarettes  . Smokeless tobacco: Never Used  . Alcohol use Yes    Allergies  Allergen Reactions  . Biaxin [Clarithromycin] Other (See Comments)    thrush  . Morphine   . Motrin [Ibuprofen]     Gastric Bypass Surgery    Current Outpatient Prescriptions  Medication Sig Dispense Refill  . doxycycline (VIBRA-TABS) 100 MG tablet Take 1 tablet (100 mg total) by mouth 2 (two) times daily. 14 tablet 0  . lisinopril (PRINIVIL,ZESTRIL) 10 MG tablet Take 10 mg by mouth daily.     No current facility-administered medications for this visit.     Review of Systems Review of Systems  Constitutional: Negative.   Respiratory: Positive for cough.   Cardiovascular: Negative.     Blood pressure (!) 156/86, pulse (!) 106, resp. rate 16, height 5\' 7"  (1.702 m), weight 221 lb (100.2 kg).  Physical Exam Physical Exam  Constitutional: She is oriented to person, place, and time. She appears well-developed and  well-nourished.  Eyes: Conjunctivae are normal. No scleral icterus.  Pulmonary/Chest: Right breast exhibits no inverted nipple, no mass, no nipple discharge, no skin change and no tenderness. Left breast exhibits no inverted nipple, no mass, no nipple discharge, no skin change and no tenderness.    Neurological: She is alert and oriented to person, place, and time.  Skin: Skin is warm and dry.    Data Reviewed None  Assessment     Blister/cyst in right upper inner quadrant of nipple- resolved. No new   lesions noted today. Stable exam  Plan    Patient to return as needed. Advised to call with any questions or concerns.    HPI, Physical Exam, Assessment and Plan have been scribed under the direction and in the presence of Mckinley Jewel, MD  Gaspar Cola, CMA  I have completed the exam and reviewed the above documentation for accuracy and completeness.  I agree with the above.  Haematologist has been used and any errors in dictation or transcription are unintentional.  Chaniece Barbato G. Jamal Collin, M.D., F.A.C.S.  Junie Panning G 05/30/2017, 1:28 PM

## 2017-08-06 ENCOUNTER — Ambulatory Visit: Payer: Self-pay | Attending: Oncology | Admitting: *Deleted

## 2017-08-06 ENCOUNTER — Ambulatory Visit
Admission: RE | Admit: 2017-08-06 | Discharge: 2017-08-06 | Disposition: A | Discharge status: Home / Self Care | Payer: Self-pay | Source: Ambulatory Visit | Attending: Oncology | Admitting: Oncology

## 2017-08-06 ENCOUNTER — Encounter: Payer: Self-pay | Admitting: *Deleted

## 2017-08-06 VITALS — BP 132/93 | HR 87 | Temp 97.9°F | Ht 66.0 in | Wt 231.0 lb

## 2017-08-06 DIAGNOSIS — N63 Unspecified lump in unspecified breast: Secondary | ICD-10-CM

## 2017-08-06 NOTE — Progress Notes (Signed)
Subjective:     Patient ID: Carol Gallegos, female   DOB: [birth year], 41 y.o.   MRN: [id number]  HPI   Review of Systems     Objective:   Physical Exam  Pulmonary/Chest: Right breast exhibits no inverted nipple, no mass, no nipple discharge, no skin change and no tenderness. Left breast exhibits no inverted nipple, no mass, no nipple discharge, no skin change and no tenderness. Breasts are symmetrical.         Assessment:     41 year old White female returns to [facility] for annual exam.  Patient states she still has an occassional spontaneous "milky" discharge from her scar site at the right areola.  States she has finished her antibiotics.  Patient was seen by Dr. Jamal Collin May 30, 2017 for left nipple infection.  He has followed her since her breast biopsy in 2017.  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  On clinical breast exam today there is no dominant mass, skin changes, nipple or scar discharge, or lymphadenopathy.  Encouraged patient to notifiy me if she continues to have discharge and I will refer her back to Dr. Jamal Collin.  Taught self breast awareness.   Last pap on 08/01/16 was -/- .  Next pap due in 2022. Blood pressure elevated at 132/93.  She is to take her meds as soon as possible and recheck her blood pressure at [facility] or [facility], and if remains higher than 140/90 she is to follow-up with her primary care provider.  Hand out on hypertention given to patient. Patient has been screened for eligibility.  She does not have any insurance, Medicare or Medicaid.  She also meets financial eligibility.  Hand-out given on the Affordable Care Act.    Plan:     Patient had birads 3 mammogram last year and did not return for her 6 month follow up.  She did have a benign biopsy.  Bilateral diagnostic mammogram and ultrasound ordered.  Will follow up per [facility] protocol.

## 2017-08-06 NOTE — Patient Instructions (Signed)
Gave patient hand-out, Women Staying Healthy, Active and Well from [facility], with education on breast health, pap smears, heart and colon health. 

## 2017-08-06 NOTE — Progress Notes (Signed)
Letter mailed from the [facility] to inform patient of her normal mammogram results.  Patient is to follow-up with annual screening in one year.  HSIS to [place].  Patient to call is she continues to have discharge from there surgical site.

## 2018-01-03 ENCOUNTER — Emergency Department: Payer: Self-pay

## 2018-01-03 ENCOUNTER — Other Ambulatory Visit: Payer: Self-pay

## 2018-01-03 ENCOUNTER — Emergency Department
Admission: EM | Admit: 2018-01-03 | Discharge: 2018-01-03 | Disposition: A | Discharge status: Home / Self Care | Payer: Self-pay | Attending: Student in an Organized Health Care Education/Training Program | Admitting: Student in an Organized Health Care Education/Training Program

## 2018-01-03 DIAGNOSIS — J069 Acute upper respiratory infection, unspecified: Secondary | ICD-10-CM | POA: Insufficient documentation

## 2018-01-03 DIAGNOSIS — Z8542 Personal history of malignant neoplasm of other parts of uterus: Secondary | ICD-10-CM | POA: Insufficient documentation

## 2018-01-03 DIAGNOSIS — B9789 Other viral agents as the cause of diseases classified elsewhere: Secondary | ICD-10-CM | POA: Insufficient documentation

## 2018-01-03 DIAGNOSIS — F17210 Nicotine dependence, cigarettes, uncomplicated: Secondary | ICD-10-CM | POA: Insufficient documentation

## 2018-01-03 DIAGNOSIS — Z79899 Other long term (current) drug therapy: Secondary | ICD-10-CM | POA: Insufficient documentation

## 2018-01-03 MED ORDER — HYDROCOD POLST-CPM POLST ER 10-8 MG/5ML PO SUER: 5.0000 mL | Freq: Every evening | ORAL | 0 refills | Status: DC | PRN

## 2018-01-03 NOTE — ED Triage Notes (Signed)
Pt c/o cough with congestion for the past couple of days

## 2018-01-03 NOTE — ED Provider Notes (Signed)
[facility] Emergency Department Provider Note  ____________________________________________   First MD Initiated Contact with Patient 01/03/18 1156     (approximate)  I have reviewed the triage vital signs and the nursing notes.   HISTORY  Chief Complaint URI  HPI Carol Gallegos is a 42 y.o. female is here with complaint of cough and congestion for the last several days.  Patient states that cough is worse at night.  She is not aware of any fever or chills.  She has taken over-the-counter medications such as Coricidin HBP cold and cough with out complete relief.  Patient does continue to smoke cigarettes.  Patient denies any vomiting or diarrhea.  There have been no body aches.    Past Medical History:  Diagnosis Date  . 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 Colon polyp 2009  . History of methicillin resistant staphylococcus aureus (MRSA)   . HPV in female     There are no active problems to display for this patient.   Past Surgical History:  Procedure Laterality Date  . ABDOMINAL HYSTERECTOMY  2001  . BLADDER SURGERY  1997  . BREAST CYST EXCISION Right 07/2016   [place] gland removed  . COLONOSCOPY  2009  . GASTRIC BYPASS  2007  . RECTAL SURGERY  2001    Prior to Admission medications   Medication Sig Start Date End Date Taking? Authorizing Provider  chlorpheniramine-HYDROcodone (TUSSIONEX PENNKINETIC ER) 10-8 MG/5ML SUER Take 5 mLs by mouth at bedtime as needed for cough. 01/03/18   Johnn Hai, PA-C  lisinopril (PRINIVIL,ZESTRIL) 10 MG tablet Take 10 mg by mouth daily.    [provider]    Allergies Biaxin [clarithromycin]; Morphine; and Motrin [ibuprofen]  Family History  Problem Relation Age of Onset  . Diabetes Mother   . Lung cancer Father   . Colon cancer Maternal Uncle   . Breast cancer Neg Hx     Social History Social History   Tobacco Use  . Smoking status: Current Every Day Smoker    Packs/day: 0.50    Types: Cigarettes  . Smokeless tobacco: Never Used  Substance Use Topics  . Alcohol use: Yes  . Drug use: No    Review of Systems Constitutional: No fever/chills Eyes: No visual changes. ENT: No sore throat.  Negative for ear pain.  Positive for nasal congestion. Cardiovascular: Denies chest pain. Respiratory: Denies shortness of breath.  Positive cough. Gastrointestinal: No abdominal pain.  No nausea, no vomiting.  No diarrhea.  Musculoskeletal: Negative for back pain. Skin: Negative for rash. Neurological: Negative for headaches, focal weakness or numbness. ___________________________________________   PHYSICAL EXAM:  VITAL SIGNS: ED Triage Vitals  Enc Vitals Group     BP 01/03/18 1143 (!) 156/87     Pulse Rate 01/03/18 1143 90     Resp 01/03/18 1143 20     Temp 01/03/18 1143 98.8 F (37.1 C)     Temp Source 01/03/18 1143 Oral     SpO2 01/03/18 1143 98 %     Weight 01/03/18 1144 200 lb (90.7 kg)     Height 01/03/18 1144 5\' 6"  (1.676 m)     Head Circumference --      Peak Flow --      Pain Score --      Pain Loc --      Pain Edu? --      Excl. in [place]? --    Constitutional: Alert and oriented. Well appearing and in  no acute distress. Eyes: Conjunctivae are normal.  Head: Atraumatic. Nose: Mild congestion/rhinnorhea.  TMs are dull bilaterally. Mouth/Throat: Mucous membranes are moist.  Oropharynx non-erythematous.  Moderate posterior drainage. Neck: No stridor.  Hematological/Lymphatic/Immunilogical: No cervical lymphadenopathy. Cardiovascular: Normal rate, regular rhythm. Grossly normal heart sounds.  Good peripheral circulation. Respiratory: Normal respiratory effort.  No retractions. Lungs CTAB. Musculoskeletal: Moves upper and lower extremities without any difficulty and normal gait was noted. Neurologic:  Normal speech and language. No gross focal neurologic deficits are appreciated.  Skin:  Skin is warm, dry and intact. No rash noted. Psychiatric: Mood and  affect are normal. Speech and behavior are normal.  ____________________________________________   LABS (all labs ordered are listed, but only abnormal results are displayed)  Labs Reviewed - No data to display  RADIOLOGY  Dg Chest 2 View  Result Date: 01/03/2018 CLINICAL DATA:  Pt c/o cough with congestion for the past couple of days. Some sob. Smoker 1/2 PPD Hx uterine cancer in 1997 EXAM: CHEST  2 VIEW COMPARISON:  12/25/2015 FINDINGS: The heart size and mediastinal contours are within normal limits. Both lungs are clear. The visualized skeletal structures are unremarkable. IMPRESSION: No active cardiopulmonary disease. Electronically Signed   By: Nolon Nations M.D.   On: 01/03/2018 12:12    ____________________________________________   PROCEDURES  Procedure(s) performed: None  Procedures  Critical Care performed: No  ____________________________________________   INITIAL IMPRESSION / ASSESSMENT AND PLAN / ED COURSE Patient is continue taking her over-the-counter Coricidin HBP medication.  Encouraged her to increase fluids.  Tylenol as needed for body aches or fever.  She was given a prescription for Tussionex 1 teaspoon at bedtime as needed for cough.  She is to follow-up with her PCP if any continued problems. ____________________________________________   FINAL CLINICAL IMPRESSION(S) / ED DIAGNOSES  Final diagnoses:  Viral URI with cough     ED Discharge Orders        Ordered    chlorpheniramine-HYDROcodone (TUSSIONEX PENNKINETIC ER) 10-8 MG/5ML SUER  At bedtime PRN     01/03/18 1242       Note:  This document was prepared using Dragon voice recognition software and may include unintentional dictation errors.   Johnn Hai, PA-C 01/03/18 1321    Merlyn Lot, MD 01/03/18 [phone]

## 2018-01-03 NOTE — Discharge Instructions (Signed)
Continue taking Coricidin HBP for your symptoms.  Increase fluids.  Tylenol as needed for headache or body aches.  Take Tussionex only at bedtime if needed for cough.  Follow-up with your doctor if any continued problems.

## 2018-07-22 ENCOUNTER — Ambulatory Visit: Payer: Self-pay | Attending: Oncology | Admitting: *Deleted

## 2018-07-22 ENCOUNTER — Encounter (INDEPENDENT_AMBULATORY_CARE_PROVIDER_SITE_OTHER): Payer: Self-pay

## 2018-07-22 ENCOUNTER — Encounter: Payer: Self-pay | Admitting: *Deleted

## 2018-07-22 ENCOUNTER — Ambulatory Visit
Admission: RE | Admit: 2018-07-22 | Discharge: 2018-07-22 | Disposition: A | Discharge status: Home / Self Care | Payer: Self-pay | Source: Ambulatory Visit | Attending: Oncology | Admitting: Oncology

## 2018-07-22 VITALS — BP 171/114 | HR 98 | Temp 98.1°F

## 2018-07-22 DIAGNOSIS — Z0000 Encounter for general adult medical examination without abnormal findings: Secondary | ICD-10-CM | POA: Insufficient documentation

## 2018-07-22 NOTE — Patient Instructions (Signed)
Gave patient hand-out, Women Staying Healthy, Active and Well from [facility], with education on breast health, pap smears, heart and colon health. 

## 2018-07-22 NOTE — Progress Notes (Signed)
  Subjective:     Patient ID: Carol Gallegos, female   DOB: January 30, 1976, 42 y.o.   MRN: [id number]  HPI   Review of Systems     Objective:   Physical Exam  Pulmonary/Chest: Right breast exhibits no inverted nipple, no mass, no nipple discharge, no skin change and no tenderness. Left breast exhibits no inverted nipple, no mass, no nipple discharge, no skin change and no tenderness.         Assessment:     42 year old White female returned to [facility] for annual screening.  Clinical breast exam unremarkable.  Taught self breast awareness.  Patient states she is hot and sweating and wonders if she is going through menopause.  She had a hysterectomy in her 20's for cervical cancer, but still has her ovaries.  Informed her it's possible she is going through menopause.  Informed her that she can have blood work completed to confirm if she is in menopause.   Blood pressure elevated at 171/114.  States she has not had her blood pressure meds today.  She is to take her meds as soon as possible and to recheck her blood pressure at [facility] or [facility], and if remains higher than 140/90 she is to follow-up with her primary care provider.  Hand out on hypertention given to patient.  Patient has been screened for eligibility.  She does not have any insurance, Medicare or Medicaid.  She also meets financial eligibility.  Hand-out given on the Affordable Care Act.   Risk Assessment    Risk Scores      07/22/2018   Last edited by: Rico Junker, RN   5-year risk: 0.8 %   Lifetime risk: 9.4 %            Plan:     Screening mammogram ordered.  Will follow-up per [facility] protocol.

## 2018-07-31 ENCOUNTER — Encounter: Payer: Self-pay | Admitting: *Deleted

## 2018-07-31 NOTE — Progress Notes (Signed)
Letter mailed from the [facility] to inform patient of her normal mammogram results.  Patient is to follow-up with annual screening in one year.  HSIS to [place]. 

## 2018-10-23 ENCOUNTER — Emergency Department
Admission: EM | Admit: 2018-10-23 | Discharge: 2018-10-23 | Disposition: A | Discharge status: Home / Self Care | Payer: Self-pay | Attending: Student in an Organized Health Care Education/Training Program | Admitting: Student in an Organized Health Care Education/Training Program

## 2018-10-23 ENCOUNTER — Encounter: Payer: Self-pay | Admitting: Emergency Medicine

## 2018-10-23 ENCOUNTER — Other Ambulatory Visit: Payer: Self-pay

## 2018-10-23 ENCOUNTER — Emergency Department: Payer: Self-pay

## 2018-10-23 DIAGNOSIS — M79661 Pain in right lower leg: Secondary | ICD-10-CM | POA: Insufficient documentation

## 2018-10-23 DIAGNOSIS — F17210 Nicotine dependence, cigarettes, uncomplicated: Secondary | ICD-10-CM | POA: Insufficient documentation

## 2018-10-23 DIAGNOSIS — Z79899 Other long term (current) drug therapy: Secondary | ICD-10-CM | POA: Insufficient documentation

## 2018-10-23 DIAGNOSIS — M79604 Pain in right leg: Secondary | ICD-10-CM

## 2018-10-23 NOTE — Discharge Instructions (Addendum)
Apply ice to the lower leg.  Take a baby aspirin for 7 days.  If you have not improved in 7 days please return to the emergency department for repeat ultrasound.

## 2018-10-23 NOTE — ED Provider Notes (Signed)
[facility] Emergency Department Provider Note  ____________________________________________   First MD Initiated Contact with Patient 10/23/18 1215     (approximate)  I have reviewed the triage vital signs and the nursing notes.   HISTORY  Chief Complaint Leg Pain    HPI Carol Gallegos is a 42 y.o. female presents emergency department complaining of right lower leg pain for 3 days.  She states the area has been very swollen and tender to touch.  She denies any fever, chills, chest pain or shortness of breath.  No history of DVTs.    Past Medical History:  Diagnosis Date  . 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 Colon polyp 2009  . History of methicillin resistant staphylococcus aureus (MRSA)   . HPV in female     There are no active problems to display for this patient.   Past Surgical History:  Procedure Laterality Date  . ABDOMINAL HYSTERECTOMY  2001  . BLADDER SURGERY  1997  . BREAST CYST EXCISION Right 07/2016   [place] gland removed  . COLONOSCOPY  2009  . GASTRIC BYPASS  2007  . RECTAL SURGERY  2001    Prior to Admission medications   Medication Sig Start Date End Date Taking? Authorizing Provider  lisinopril (PRINIVIL,ZESTRIL) 10 MG tablet Take 10 mg by mouth daily.    [provider]    Allergies Biaxin [clarithromycin]; Morphine; and Motrin [ibuprofen]  Family History  Problem Relation Age of Onset  . Diabetes Mother   . Lung cancer Father   . Colon cancer Maternal Uncle   . Breast cancer Neg Hx     Social History Social History   Tobacco Use  . Smoking status: Current Every Day Smoker    Packs/day: 0.50    Types: Cigarettes  . Smokeless tobacco: Never Used  Substance Use Topics  . Alcohol use: Yes  . Drug use: No    Review of Systems  Constitutional: No fever/chills Eyes: No visual changes. ENT: No sore throat. Respiratory: Denies cough Genitourinary: Negative for dysuria. Musculoskeletal:  Negative for back pain.  Right lower leg pain Skin: Negative for rash.    ____________________________________________   PHYSICAL EXAM:  VITAL SIGNS: ED Triage Vitals  Enc Vitals Group     BP 10/23/18 1204 (!) 153/88     Pulse Rate 10/23/18 1204 (!) 104     Resp 10/23/18 1204 20     Temp 10/23/18 1204 98.2 F (36.8 C)     Temp Source 10/23/18 1204 Oral     SpO2 10/23/18 1204 96 %     Weight 10/23/18 1205 225 lb (102.1 kg)     Height 10/23/18 1205 5\' 7"  (1.702 m)     Head Circumference --      Peak Flow --      Pain Score 10/23/18 1207 10     Pain Loc --      Pain Edu? --      Excl. in [place]? --     Constitutional: Alert and oriented. Well appearing and in no acute distress. Eyes: Conjunctivae are normal.  Head: Atraumatic. Nose: No congestion/rhinnorhea. Mouth/Throat: Mucous membranes are moist.   Neck:  supple no lymphadenopathy noted Cardiovascular: Normal rate, regular rhythm. Heart sounds are normal Respiratory: Normal respiratory effort.  No retractions, lungs c t a  GU: deferred Musculoskeletal: FROM all extremities, warm and well perfused.  The right calf is tender to palpation.  There is swelling noted. Neurologic:  Normal speech  and language.  Skin:  Skin is warm, dry and intact. No rash noted. Psychiatric: Mood and affect are normal. Speech and behavior are normal.  ____________________________________________   LABS (all labs ordered are listed, but only abnormal results are displayed)  Labs Reviewed - No data to display ____________________________________________   ____________________________________________  RADIOLOGY  Ultrasound of the right lower leg is negative for DVT  ____________________________________________   PROCEDURES  Procedure(s) performed: No  Procedures    ____________________________________________   INITIAL IMPRESSION / ASSESSMENT AND PLAN / ED COURSE  Pertinent labs & imaging results that were available during  my care of the patient were reviewed by me and considered in my medical decision making (see chart for details).   Patient is a 42 year old female presents emergency department complaining of right lower leg pain.  Physical exam patient appears well.  The right calf is tender to palpation.  No redness or warmth is noted.  Ultrasound of the right lower leg is negative for DVT  Explained findings to the patient.  Due to the amount of swelling I encouraged her to take a baby aspirin for at least 7 days.  If she is not improved in 7 days she is to return to the emergency department for repeat ultrasound.  Or her regular doctor could order a repeat ultrasound.  She states she understands will comply.  If she is worsening she should return to emergency department.  She states she understands and was discharged in stable condition.     As part of my medical decision making, I reviewed the following data within the [facility] notes reviewed and incorporated, Old chart reviewed, Radiograph reviewed ultrasound right lower leg is negative, Notes from prior ED visits and [place] Controlled Substance Database  ____________________________________________   FINAL CLINICAL IMPRESSION(S) / ED DIAGNOSES  Final diagnoses:  Right leg pain      NEW MEDICATIONS STARTED DURING THIS VISIT:  Current Discharge Medication List       Note:  This document was prepared using Dragon voice recognition software and may include unintentional dictation errors.    Versie Starks, PA-C 10/23/18 1357    Merlyn Lot, MD 10/23/18 [phone]

## 2018-10-23 NOTE — ED Triage Notes (Addendum)
Pt c/o RT lower leg pain x2-3 days, tender to touch. Denies fever or redness. Pt states she had fall last week that may be contributing but unkown. NAD noted, ambulatory.

## 2018-11-07 ENCOUNTER — Other Ambulatory Visit: Payer: Self-pay

## 2018-11-07 ENCOUNTER — Emergency Department
Admission: EM | Admit: 2018-11-07 | Discharge: 2018-11-07 | Disposition: A | Discharge status: Home / Self Care | Payer: Self-pay | Attending: Emergency Medicine | Admitting: Emergency Medicine

## 2018-11-07 ENCOUNTER — Emergency Department: Payer: Self-pay

## 2018-11-07 ENCOUNTER — Encounter: Payer: Self-pay | Admitting: Emergency Medicine

## 2018-11-07 DIAGNOSIS — Z79899 Other long term (current) drug therapy: Secondary | ICD-10-CM | POA: Insufficient documentation

## 2018-11-07 DIAGNOSIS — Z8542 Personal history of malignant neoplasm of other parts of uterus: Secondary | ICD-10-CM | POA: Insufficient documentation

## 2018-11-07 DIAGNOSIS — F17210 Nicotine dependence, cigarettes, uncomplicated: Secondary | ICD-10-CM | POA: Insufficient documentation

## 2018-11-07 DIAGNOSIS — M79604 Pain in right leg: Secondary | ICD-10-CM | POA: Insufficient documentation

## 2018-11-07 MED ORDER — CYCLOBENZAPRINE HCL 10 MG PO TABS: 10.0000 mg | ORAL_TABLET | Freq: Three times a day (TID) | ORAL | 0 refills | Status: AC | PRN

## 2018-11-07 MED ORDER — DICLOFENAC SODIUM 1 % TD GEL: 4.0000 g | Freq: Four times a day (QID) | TRANSDERMAL | 1 refills | Status: AC

## 2018-11-07 NOTE — ED Notes (Signed)
See triage note  Presents with pain to right leg  States she was seen about 2 weeks ago for pain to right lower leg  Had doppler study done and was told that is was negative   States 2 days ago she developed pain from knee into lateral aspect of thigh

## 2018-11-07 NOTE — ED Provider Notes (Signed)
[facility] Emergency Department Provider Note ____________________________________________  Time seen: Approximately 10:05 AM  I have reviewed the triage vital signs and the nursing notes.   HISTORY  Chief Complaint Leg Pain    HPI Carol Gallegos is a 42 y.o. female who presents to the emergency department for evaluation and treatment of pain in the RLE. She was evaluated here on 10/23/2018 for the same and had a negative [place]. 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 Since her visit here, she has been taking 81mg  ASA without relief. She states that the swelling has been better, but the pain is now radiating "deep into the bone and all the way into my hip."   Past Medical History:  Diagnosis Date  . Cancer ([place]) 1997   uterine  . Colon polyp 2009  . History of methicillin resistant staphylococcus aureus (MRSA)   . HPV in female     There are no active problems to display for this patient.   Past Surgical History:  Procedure Laterality Date  . ABDOMINAL HYSTERECTOMY  2001  . BLADDER SURGERY  1997  . BREAST CYST EXCISION Right 07/2016   [place] gland removed  . COLONOSCOPY  2009  . GASTRIC BYPASS  2007  . RECTAL SURGERY  2001    Prior to Admission medications   Medication Sig Start Date End Date Taking? Authorizing Provider  cyclobenzaprine (FLEXERIL) 10 MG tablet Take 1 tablet (10 mg total) by mouth 3 (three) times daily as needed for muscle spasms. 11/07/18   Zakeya Junker, Johnette Abraham B, FNP  diclofenac sodium (VOLTAREN) 1 % GEL Apply 4 g topically 4 (four) times daily. 11/07/18   Maila Dukes B, FNP  lisinopril (PRINIVIL,ZESTRIL) 10 MG tablet Take 10 mg by mouth daily.    [provider]    Allergies Biaxin [clarithromycin]; Morphine; and Motrin [ibuprofen]  Family History  Problem Relation Age of Onset  . Diabetes Mother   . Lung cancer Father   . Colon cancer Maternal Uncle   . Breast cancer Neg Hx     Social  History Social History   Tobacco Use  . Smoking status: Current Every Day Smoker    Packs/day: 0.50    Types: Cigarettes  . Smokeless tobacco: Never Used  Substance Use Topics  . Alcohol use: Yes  . Drug use: No    Review of Systems Constitutional: Negative for fever. Cardiovascular: Negative for chest pain. Respiratory: Negative for shortness of breath. Musculoskeletal: Positive for right leg pain. Skin: Negative for open wounds, lesions. Positive for bilateral lower extremity edema that decreases with rest.  Neurological: Negative for decrease in sensation  ____________________________________________   PHYSICAL EXAM:  VITAL SIGNS: ED Triage Vitals  Enc Vitals Group     BP 11/07/18 0944 (!) 153/102     Pulse Rate 11/07/18 0944 (!) 102     Resp 11/07/18 0944 17     Temp 11/07/18 0944 97.9 F (36.6 C)     Temp Source 11/07/18 0944 Oral     SpO2 11/07/18 0944 100 %     Weight 11/07/18 0945 225 lb (102.1 kg)     Height 11/07/18 0945 5\' 7"  (1.702 m)     Head Circumference --      Peak Flow --      Pain Score 11/07/18 0944 4     Pain Loc --      Pain Edu? --      Excl. in [place]? --  Constitutional: Alert and oriented. Well appearing and in no acute distress. Eyes: Conjunctivae are clear without discharge or drainage Head: Atraumatic Neck: Supple Respiratory: No cough. Respirations are even and unlabored. Musculoskeletal: FROM of all extremities observed. Diffuse pain in the right leg. Neurologic: Motor and sensory function is intact. Ambulates with unassisted steady gait  Skin: RLE intact. No erythema or edema.  Psychiatric: Affect and behavior are appropriate.  ____________________________________________   LABS (all labs ordered are listed, but only abnormal results are displayed)  Labs Reviewed - No data to display ____________________________________________  RADIOLOGY  Images of the RLE are negative for acute bony abnormality per  radiology. ____________________________________________   PROCEDURES  Procedures  ____________________________________________   INITIAL IMPRESSION / ASSESSMENT AND PLAN / ED COURSE  Carol Gallegos is a 42 y.o. who presents to the emergency department for treatment and evaluation of RLE pain. Exam and complaint is not consistent with DVT. Homan's sign is negative. She has had no shortness of breath or chest pain. She has no history of DVT or risk factors of concern. She will be treated with flexeril and voltaren gel. She is to follow up with PCP if not improving over the next few days. She is to return to the ER for symptoms that change or worsen if unable to schedule an appointment.  Medications - No data to display  Pertinent labs & imaging results that were available during my care of the patient were reviewed by me and considered in my medical decision making (see chart for details).  _________________________________________   FINAL CLINICAL IMPRESSION(S) / ED DIAGNOSES  Final diagnoses:  Leg pain, diffuse, right    ED Discharge Orders         Ordered    cyclobenzaprine (FLEXERIL) 10 MG tablet  3 times daily PRN     11/07/18 1121    diclofenac sodium (VOLTAREN) 1 % GEL  4 times daily     11/07/18 1121           If controlled substance prescribed during this visit, 12 month history viewed on the [place] prior to issuing an initial prescription for Schedule II or III opiod.   Victorino Dike, FNP 11/08/18 1026    Schuyler Amor, MD 11/08/18 [phone]

## 2018-11-07 NOTE — ED Triage Notes (Signed)
Pt was seen here on 10/30 and had a negative ultrasound. Pt was told if her leg did not improve in 7 days she was to return for a repeat ultrasound to rule out DVT. Pt denies any new complaints/changes.

## 2018-11-07 NOTE — Discharge Instructions (Signed)
Please follow up with your primary care provider. ° °Return to the ER for symptoms that change or worsen if unable to schedule an appointment. °

## 2018-11-07 NOTE — ED Triage Notes (Signed)
First RN Note: Pt presents to ED via POV with c/o R leg pain. Pt states was seen on 10/30 for pain and had negative [place] for DVT. Pt states was dx with contusions to R leg and was told if pain not relieved to return for poss repeat US. Pt states the pain "now feels like it's in the bone". Pt ambulatory with mild limp to the desk.

## 2019-03-05 ENCOUNTER — Emergency Department: Payer: Self-pay

## 2019-03-05 ENCOUNTER — Emergency Department
Admission: EM | Admit: 2019-03-05 | Discharge: 2019-03-05 | Disposition: A | Discharge status: Home / Self Care | Payer: Self-pay | Attending: Emergency Medicine | Admitting: Emergency Medicine

## 2019-03-05 ENCOUNTER — Encounter: Payer: Self-pay | Admitting: *Deleted

## 2019-03-05 DIAGNOSIS — Z79899 Other long term (current) drug therapy: Secondary | ICD-10-CM | POA: Insufficient documentation

## 2019-03-05 DIAGNOSIS — F17210 Nicotine dependence, cigarettes, uncomplicated: Secondary | ICD-10-CM | POA: Insufficient documentation

## 2019-03-05 DIAGNOSIS — Z8541 Personal history of malignant neoplasm of cervix uteri: Secondary | ICD-10-CM | POA: Insufficient documentation

## 2019-03-05 DIAGNOSIS — J4521 Mild intermittent asthma with (acute) exacerbation: Secondary | ICD-10-CM | POA: Insufficient documentation

## 2019-03-05 MED ORDER — PREDNISONE 20 MG PO TABS
60.0000 mg | ORAL_TABLET | Freq: Once | ORAL | Status: AC
  Administered 2019-03-05: 60 mg via ORAL
  Filled 2019-03-05: qty 3

## 2019-03-05 MED ORDER — IPRATROPIUM-ALBUTEROL 0.5-2.5 (3) MG/3ML IN SOLN
3.0000 mL | Freq: Once | RESPIRATORY_TRACT | Status: AC
  Administered 2019-03-05: 3 mL via RESPIRATORY_TRACT
  Filled 2019-03-05: qty 3

## 2019-03-05 MED ORDER — PREDNISONE 10 MG (21) PO TBPK: ORAL_TABLET | Freq: Every day | ORAL | 0 refills | Status: AC

## 2019-03-05 MED ORDER — ALBUTEROL SULFATE HFA 108 (90 BASE) MCG/ACT IN AERS: 2.0000 | INHALATION_SPRAY | Freq: Four times a day (QID) | RESPIRATORY_TRACT | 2 refills | Status: AC | PRN

## 2019-03-05 MED ORDER — ALBUTEROL SULFATE (2.5 MG/3ML) 0.083% IN NEBU: 2.5000 mg | INHALATION_SOLUTION | Freq: Four times a day (QID) | RESPIRATORY_TRACT | 12 refills | Status: DC | PRN

## 2019-03-05 MED ORDER — BENZONATATE 100 MG PO CAPS: 200.0000 mg | ORAL_CAPSULE | Freq: Three times a day (TID) | ORAL | 0 refills | Status: AC | PRN

## 2019-03-05 NOTE — ED Triage Notes (Signed)
Pt is brought in by ems for URI.  Pt has a mask on.  Pt states that she has had a 4-5 day hx of URI with cough and cold and pain in her chest and back when coughing.  Pt reports that she has pressure on back and in chest which is a pressure and a soreness.  Pt is concerned about "bronchitis or walking pneumonia".  Pt states that she has phlegm in her lungs but she can't cough it up. No fever with this.

## 2019-03-05 NOTE — ED Provider Notes (Signed)
[facility] Emergency Department Provider Note   ____________________________________________   First MD Initiated Contact with Patient 03/05/19 1132     (approximate)  I have reviewed the triage vital signs and the nursing notes.   HISTORY  Chief Complaint URI    HPI Carol Gallegos is a 43 y.o. female patient state URI signs symptoms consistent with cough and chest congestion for 4 to 5 days.  Patient said her cough is nonproductive.  Patient also complained of pressure in her back and chest.  Patient unsure of fever.  Patient denies nausea, vomiting, diarrhea.  Patient rates the pain as 8/10.  Patient described the pain is "achy".  No palliative measures for complaint.  Patient also requesting refill of albuterol nebulizer solution and inhaler.         Past Medical History:  Diagnosis Date  . Cancer ([place]) 1997   uterine  . Colon polyp 2009  . History of methicillin resistant staphylococcus aureus (MRSA)   . HPV in female     There are no active problems to display for this patient.   Past Surgical History:  Procedure Laterality Date  . ABDOMINAL HYSTERECTOMY  2001  . BLADDER SURGERY  1997  . BREAST CYST EXCISION Right 07/2016   [place] gland removed  . COLONOSCOPY  2009  . GASTRIC BYPASS  2007  . RECTAL SURGERY  2001    Prior to Admission medications   Medication Sig Start Date End Date Taking? Authorizing Provider  albuterol (PROVENTIL HFA;VENTOLIN HFA) 108 (90 Base) MCG/ACT inhaler Inhale 2 puffs into the lungs every 6 (six) hours as needed for wheezing or shortness of breath. 03/05/19   Sable Feil, PA-C  albuterol (PROVENTIL) (2.5 MG/3ML) 0.083% nebulizer solution Take 3 mLs (2.5 mg total) by nebulization every 6 (six) hours as needed for wheezing or shortness of breath. 03/05/19   Sable Feil, PA-C  benzonatate (TESSALON PERLES) 100 MG capsule Take 2 capsules (200 mg total) by mouth 3 (three) times daily as needed. 03/05/19  03/04/20  Sable Feil, PA-C  cyclobenzaprine (FLEXERIL) 10 MG tablet Take 1 tablet (10 mg total) by mouth 3 (three) times daily as needed for muscle spasms. 11/07/18   Triplett, Johnette Abraham B, FNP  diclofenac sodium (VOLTAREN) 1 % GEL Apply 4 g topically 4 (four) times daily. 11/07/18   Triplett, Cari B, FNP  lisinopril (PRINIVIL,ZESTRIL) 10 MG tablet Take 10 mg by mouth daily.    [provider]  predniSONE (STERAPRED UNI-PAK 21 TAB) 10 MG (21) TBPK tablet Take by mouth daily. 03/05/19   Sable Feil, PA-C    Allergies Biaxin [clarithromycin]; Morphine; and Motrin [ibuprofen]  Family History  Problem Relation Age of Onset  . Diabetes Mother   . Lung cancer Father   . Colon cancer Maternal Uncle   . Breast cancer Neg Hx     Social History Social History   Tobacco Use  . Smoking status: Current Every Day Smoker    Packs/day: 0.50    Types: Cigarettes  . Smokeless tobacco: Never Used  Substance Use Topics  . Alcohol use: Yes  . Drug use: No    Review of Systems  Constitutional: No fever/chills Eyes: No visual changes. ENT: No sore throat. Cardiovascular: Denies chest pain. Respiratory: Denies shortness of breath. Gastrointestinal: No abdominal pain.  No nausea, no vomiting.  No diarrhea.  No constipation. Genitourinary: Negative for dysuria. Musculoskeletal: Negative for back pain. Skin: Negative for rash. Neurological: Negative for headaches,  focal weakness or numbness. Endocrine:  Hypertension Allergic/Immunilogical: Biaxin, morphine, and ibuprofen.  ____________________________________________   PHYSICAL EXAM:  VITAL SIGNS: ED Triage Vitals  Enc Vitals Group     BP 03/05/19 1055 (!) 164/86     Pulse Rate 03/05/19 1055 97     Resp 03/05/19 1055 16     Temp 03/05/19 1055 98.9 F (37.2 C)     Temp Source 03/05/19 1055 Oral     SpO2 03/05/19 1055 97 %     Weight 03/05/19 1056 235 lb (106.6 kg)     Height 03/05/19 1056 5\' 7"  (1.702 m)     Head  Circumference --      Peak Flow --      Pain Score 03/05/19 1055 8     Pain Loc --      Pain Edu? --      Excl. in [place]? --    Constitutional: Alert and oriented. Well appearing and in no acute distress. Neck: No stridor.   Hematological/Lymphatic/Immunilogical: No cervical lymphadenopathy. Cardiovascular: Normal rate, regular rhythm. Grossly normal heart sounds.  Good peripheral circulation. Respiratory: Normal respiratory effort.  No retractions. Lungs expiratory and expiratory wheezing. Gastrointestinal: Soft and nontender. No distention. No abdominal bruits. No CVA tenderness. Skin:  Skin is warm, dry and intact. No rash noted. Psychiatric: Mood and affect are normal. Speech and behavior are normal.  ____________________________________________   LABS (all labs ordered are listed, but only abnormal results are displayed)  Labs Reviewed - No data to display ____________________________________________  EKG  Read by heart station Dr. no acute findings. ____________________________________________  RADIOLOGY  ED MD interpretation:  Official radiology report(s): Dg Chest 2 View  Result Date: 03/05/2019 CLINICAL DATA:  Cough and shortness of breath. EXAM: CHEST - 2 VIEW COMPARISON:  01/03/2018, 12/25/2015 and 11/23/2009 FINDINGS: The heart size and mediastinal contours are within normal limits. Both lungs are clear. The visualized skeletal structures are unremarkable. IMPRESSION: Normal exam. Electronically Signed   By: Lorriane Shire M.D.   On: 03/05/2019 11:46    ____________________________________________   PROCEDURES  Procedure(s) performed (including Critical Care):  Procedures   ____________________________________________   INITIAL IMPRESSION / ASSESSMENT AND PLAN / ED COURSE  As part of my medical decision making, I reviewed the following data within the [facility]         Patient arrived via EMS complaining cough and wheeze.  Patient  x-rays are unremarkable.  Patient physical exam is consistent with asthma exacerbation.  Patient given DuoNeb and prednisone.  Patient discharged with prescription for albuterol solution and inhaler.  Patient also given prescription for Tessalon.  Patient advised to follow-up with PCP for continued care.      ____________________________________________   FINAL CLINICAL IMPRESSION(S) / ED DIAGNOSES  Final diagnoses:  Mild intermittent asthma with acute exacerbation     ED Discharge Orders         Ordered    benzonatate (TESSALON PERLES) 100 MG capsule  3 times daily PRN     03/05/19 1205    albuterol (PROVENTIL HFA;VENTOLIN HFA) 108 (90 Base) MCG/ACT inhaler  Every 6 hours PRN     03/05/19 1205    albuterol (PROVENTIL) (2.5 MG/3ML) 0.083% nebulizer solution  Every 6 hours PRN     03/05/19 1205    predniSONE (STERAPRED UNI-PAK 21 TAB) 10 MG (21) TBPK tablet  Daily     03/05/19 1206           Note:  This document  was prepared using Systems analyst and may include unintentional dictation errors.    Sable Feil, PA-C 03/05/19 1208    Nance Pear, MD 03/05/19 [phone]

## 2019-03-05 NOTE — ED Notes (Signed)

## 2019-03-05 NOTE — ED Notes (Signed)
First Nurse Note: Patient to ED via EMS with persistant cough X 4 mos. With chest pain with cough.  BP - 177/110 at scene.  Pulse ox - 100% on RA, HR 101 after Albuterol neb on scene.

## 2019-07-21 NOTE — Progress Notes (Signed)
Phoned patient for virtual screening for 07/23/2019 8:00 appointment.  Patient requests to call back and reschedule when she feels comfortable due to anxiety about COVID 19 exposure.  She has respiratory problems.  She is to call Jeanella Anton to reschedule.  Cancelled taxi transportation, and notified Barnie Del at [facility].

## 2019-07-22 IMAGING — CR DG TIBIA/FIBULA 2V*R*
1 series · 2 of 2 positions shown · non-contrast
Comparison: None.

CLINICAL DATA: Acute RIGHT LOWER leg pain for 2 days. Initial
encounter.

EXAM:
RIGHT TIBIA AND FIBULA - 2 VIEW

[Series 1: dg tibia/fibula right · 0.14mm/px · 2 of 2 slices shown]
[im 1/2]
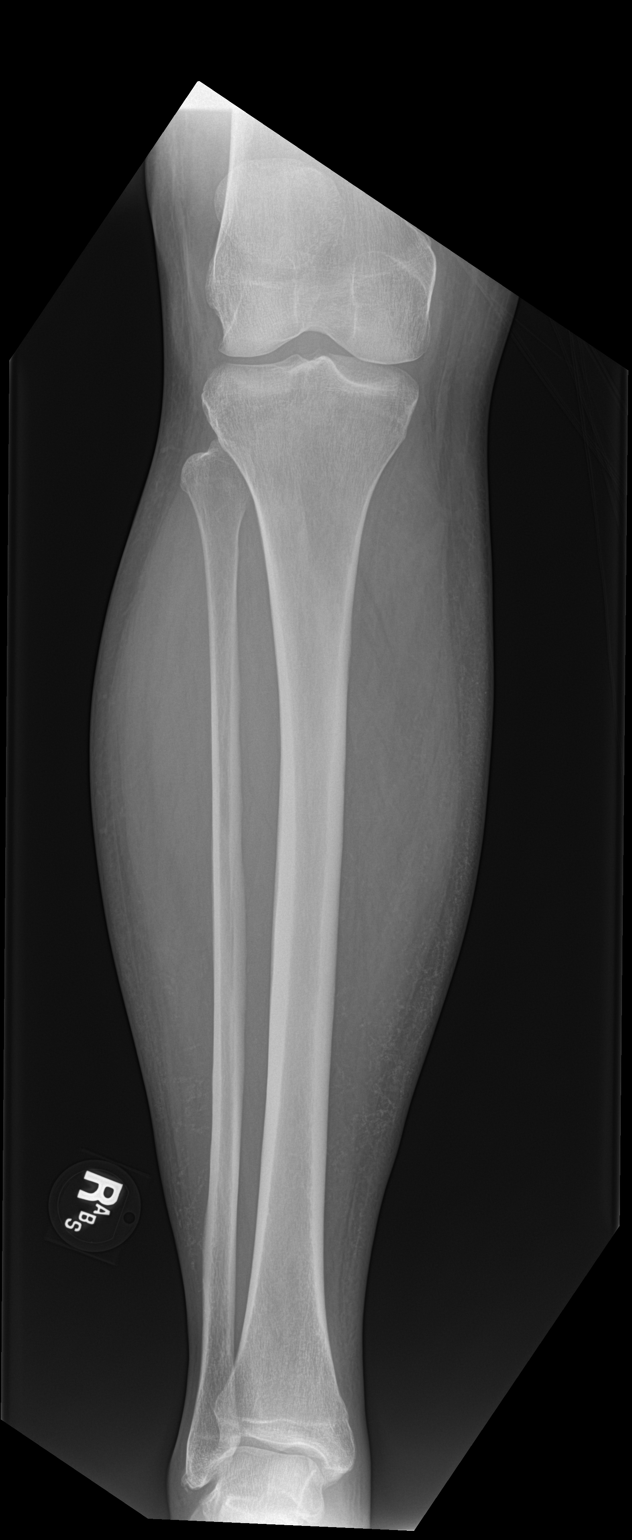
[im 2/2]
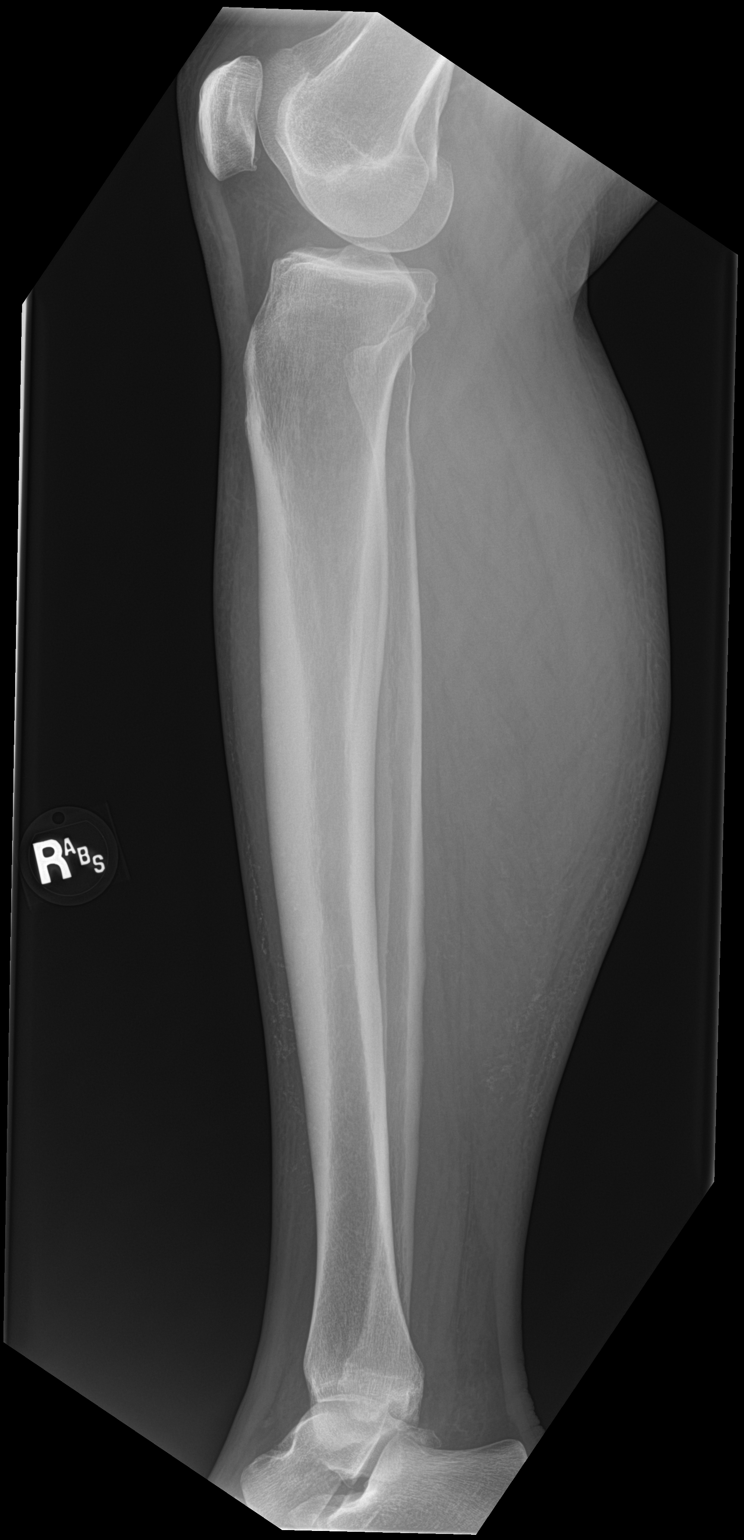

[2 of 2 positions shown; findings below may reference images not displayed]

FINDINGS: No fracture, subluxation or dislocation.

No focal bony lesions are identified.

No definite soft tissue abnormalities noted.
IMPRESSION: Negative.

## 2019-07-23 ENCOUNTER — Ambulatory Visit: Payer: Self-pay

## 2019-11-17 IMAGING — CR CHEST - 2 VIEW
2 series · 2 of 2 positions shown · non-contrast
Comparison: 01/03/2018, 12/25/2015 and 11/23/2009

CLINICAL DATA: Cough and shortness of breath.

EXAM:
CHEST - 2 VIEW

[chest pa]
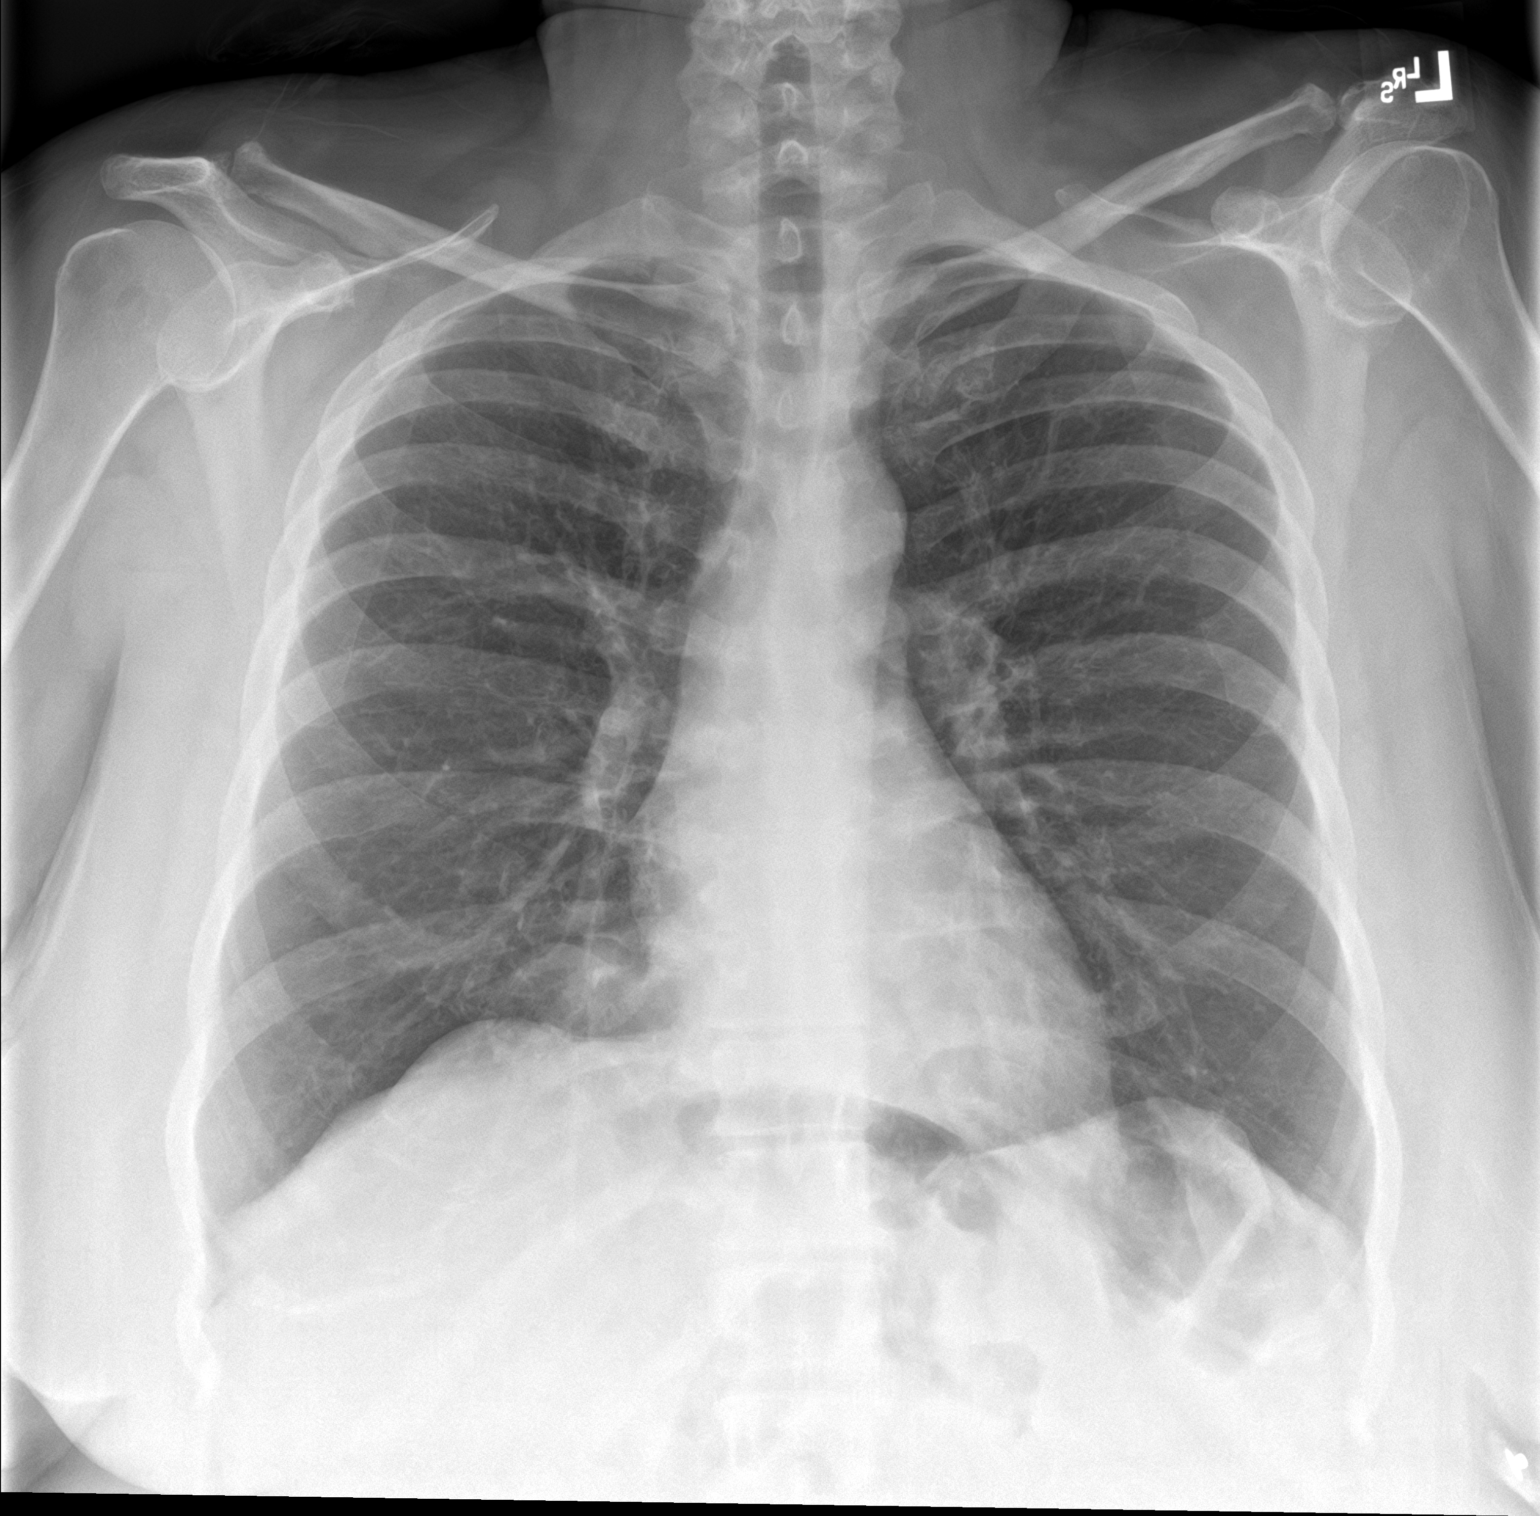

[chest lat]
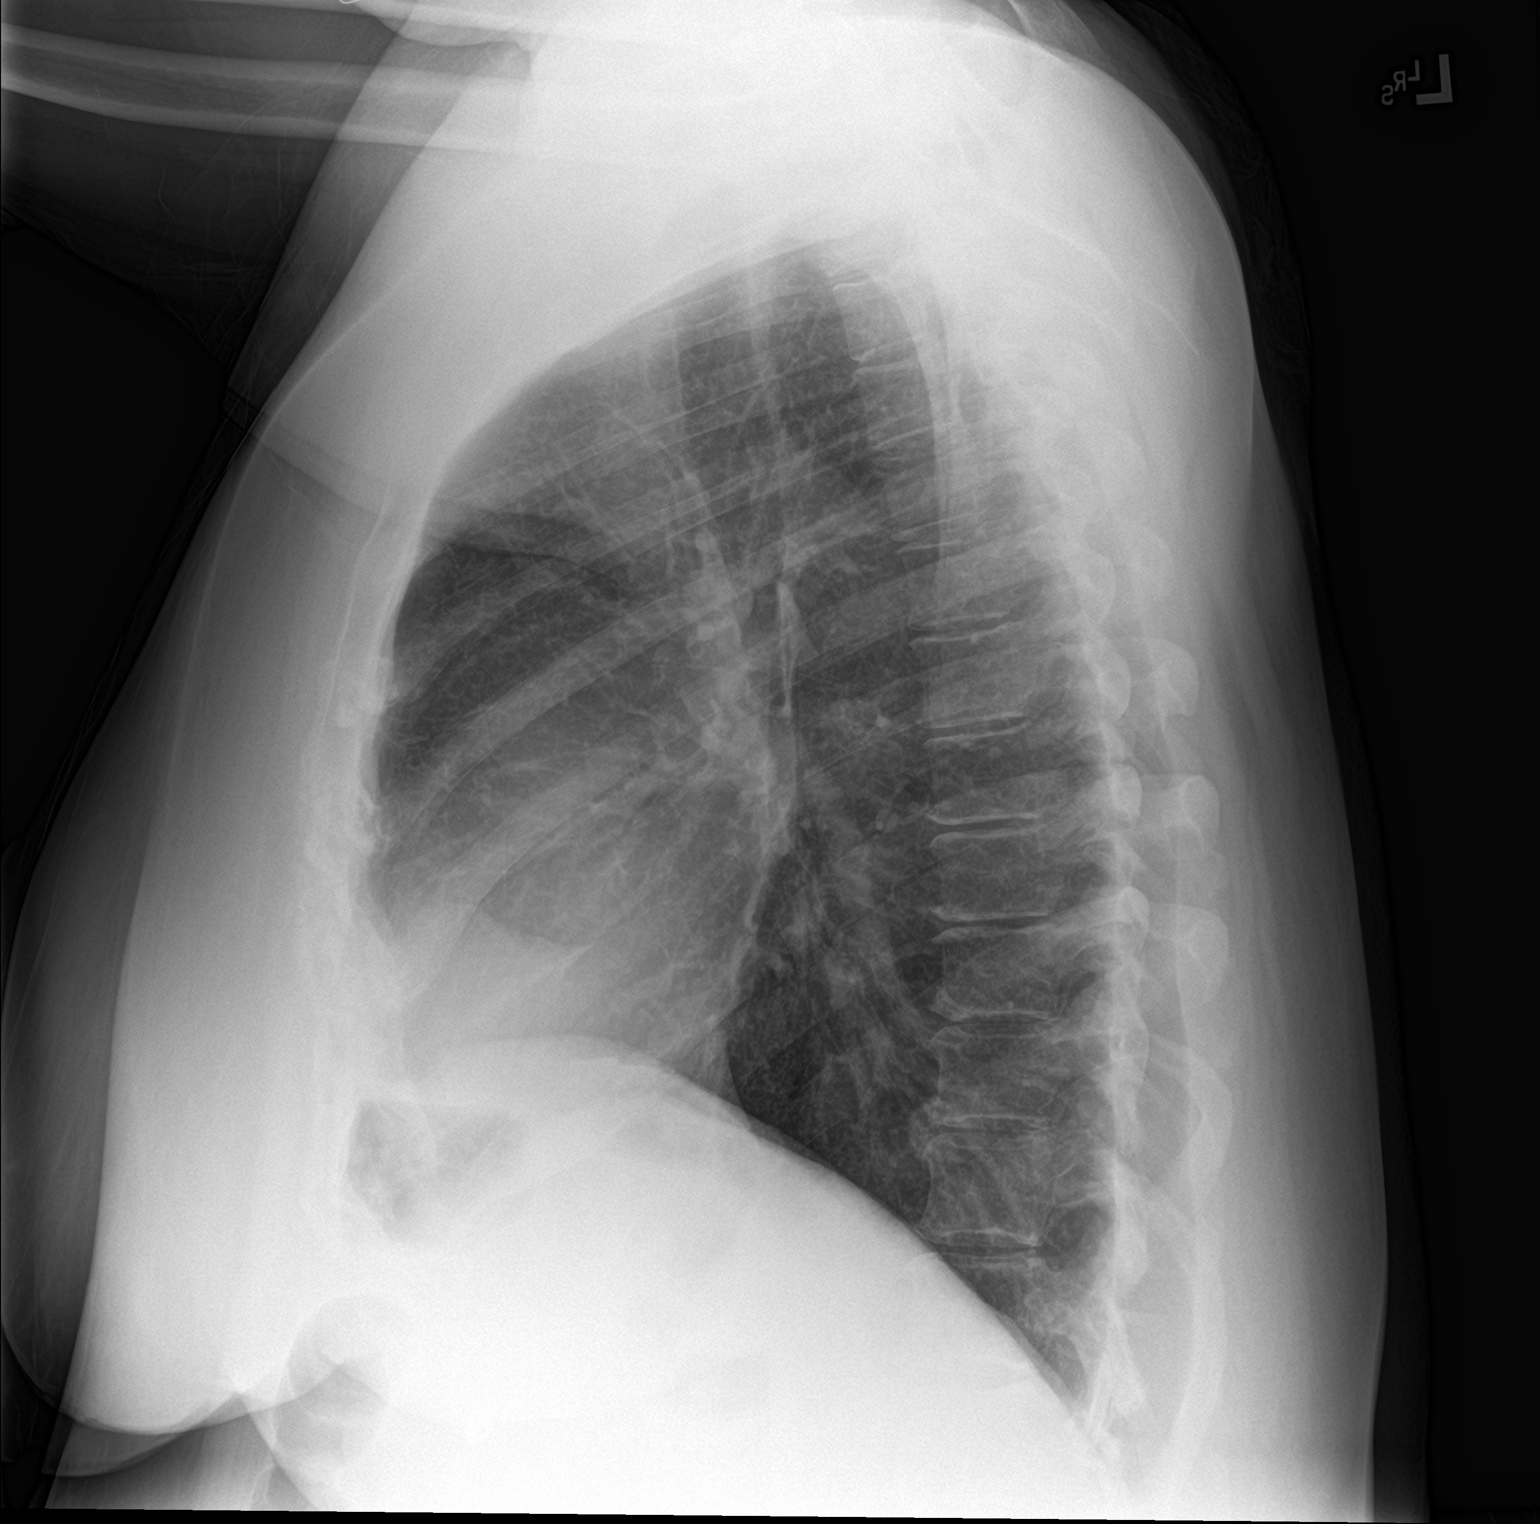

[2 of 2 positions shown; findings below may reference images not displayed]

FINDINGS: The heart size and mediastinal contours are within normal limits.
Both lungs are clear. The visualized skeletal structures are
unremarkable.
IMPRESSION: Normal exam.

## 2020-07-20 ENCOUNTER — Ambulatory Visit
Admission: RE | Admit: 2020-07-20 | Discharge: 2020-07-20 | Disposition: A | Discharge status: Home / Self Care | Payer: Self-pay | Source: Ambulatory Visit | Attending: Oncology | Admitting: Oncology

## 2020-07-20 ENCOUNTER — Ambulatory Visit: Payer: Self-pay | Attending: Oncology

## 2020-07-20 ENCOUNTER — Other Ambulatory Visit: Payer: Self-pay

## 2020-07-20 VITALS — BP 171/104 | HR 102 | Temp 97.6°F | Resp 16 | Wt 222.8 lb

## 2020-07-20 DIAGNOSIS — Z0000 Encounter for general adult medical examination without abnormal findings: Secondary | ICD-10-CM | POA: Insufficient documentation

## 2020-07-20 NOTE — Progress Notes (Signed)
  Subjective:     Patient ID: Candela Krul, female   DOB: 07/13/1976, 44 y.o.   MRN: [id number]  HPI   Review of Systems     Objective:   Physical Exam Chest:     Breasts:        Right: No swelling, bleeding, inverted nipple, mass, nipple discharge, skin change or tenderness.        Left: No swelling, bleeding, inverted nipple, mass, nipple discharge, skin change or tenderness.        Assessment:     44 year old patient returns for BCCCP screening. Patient screened, and meets BCCCP eligibility.  Patient does not have insurance, Medicare or Medicaid.  Instructed patient on breast self awareness using teach back method.  Clinical breast exam unremarkable.  No mass or lump palpated.  Patient to monitor blood pressure,and contact primary provider to discuss medication adjustment.  Remains elevated on recheck. She had bariatric surgery in 2007 in [place].  Requested, anf given info on [place].  Risk Assessment    Risk Scores      07/20/2020 07/22/2018   Last edited by: Manus Rudd, RN Rico Junker, RN   5-year risk: 0.9 % 0.8 %   Lifetime risk: 9.2 % 9.4 %            Plan:     Sent for bilateral screening mammogram.

## 2020-08-09 NOTE — Progress Notes (Signed)
Letter mailed from [facility] to notify of normal mammogram results.  Patient to return in one year for annual screening.  Copy to [facility]. 

## 2021-04-03 IMAGING — MG DIGITAL SCREENING BILAT W/ TOMO W/ CAD
6 of 12 series · 6 of 36 positions shown · non-contrast
Comparison: Previous exam(s).

CLINICAL DATA: Screening.

EXAM:
DIGITAL SCREENING BILATERAL MAMMOGRAM WITH TOMO AND CAD

[L MLO synth-2D]
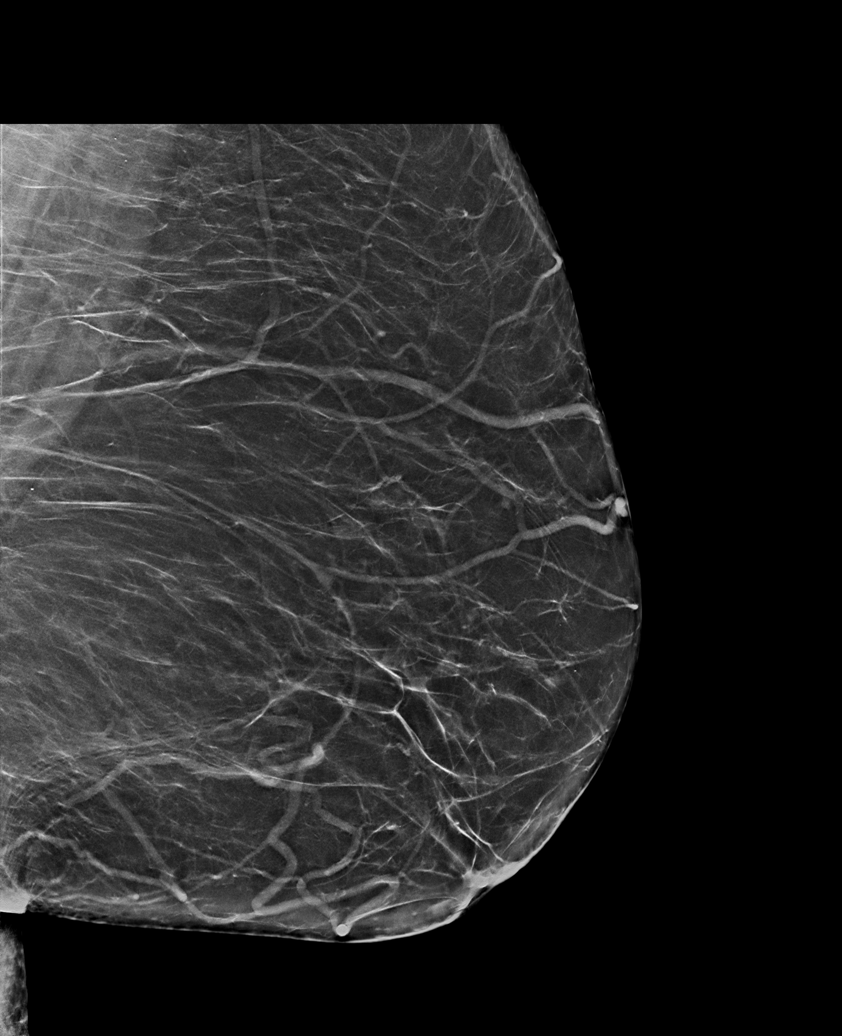

[R CC synth-2D (1 of 2)]
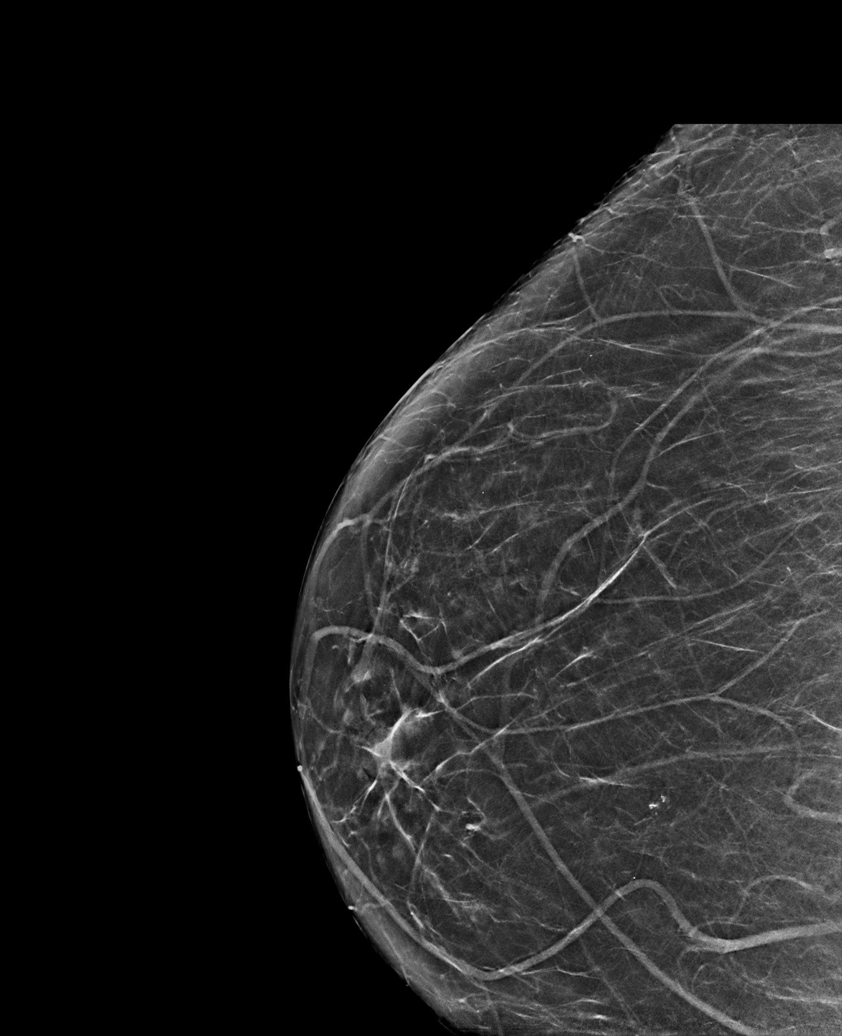

[L CC synth-2D (1 of 2)]
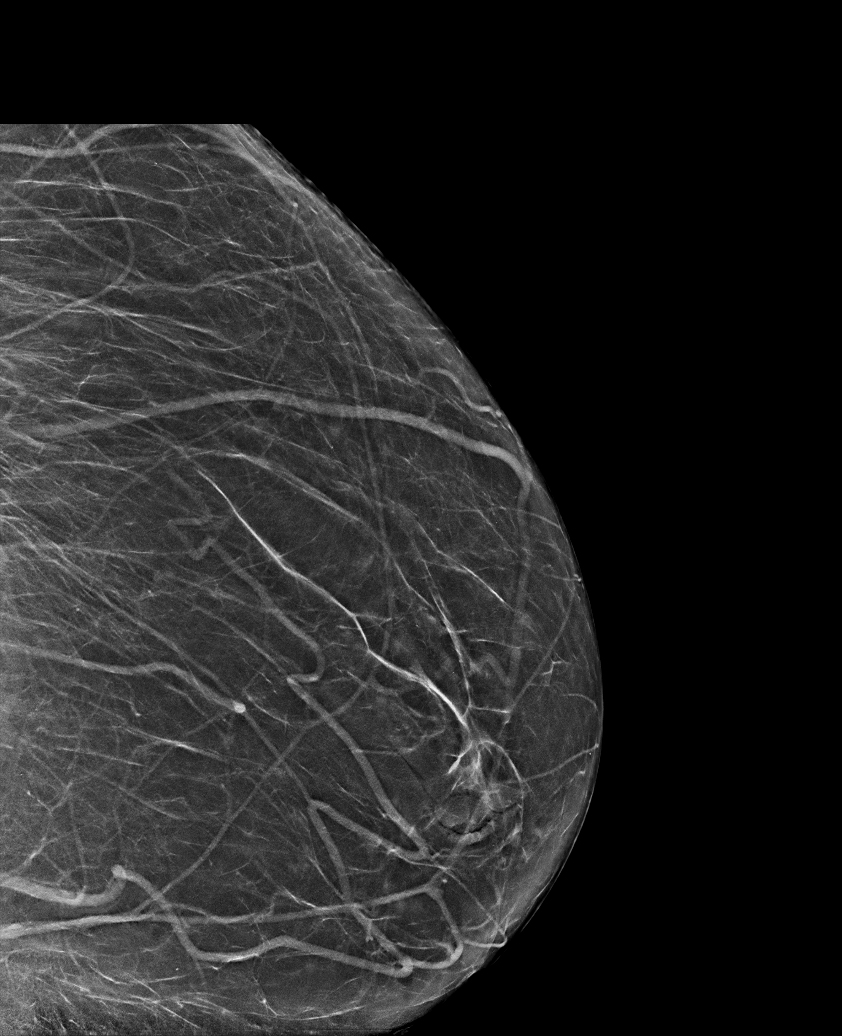

[R MLO synth-2D]
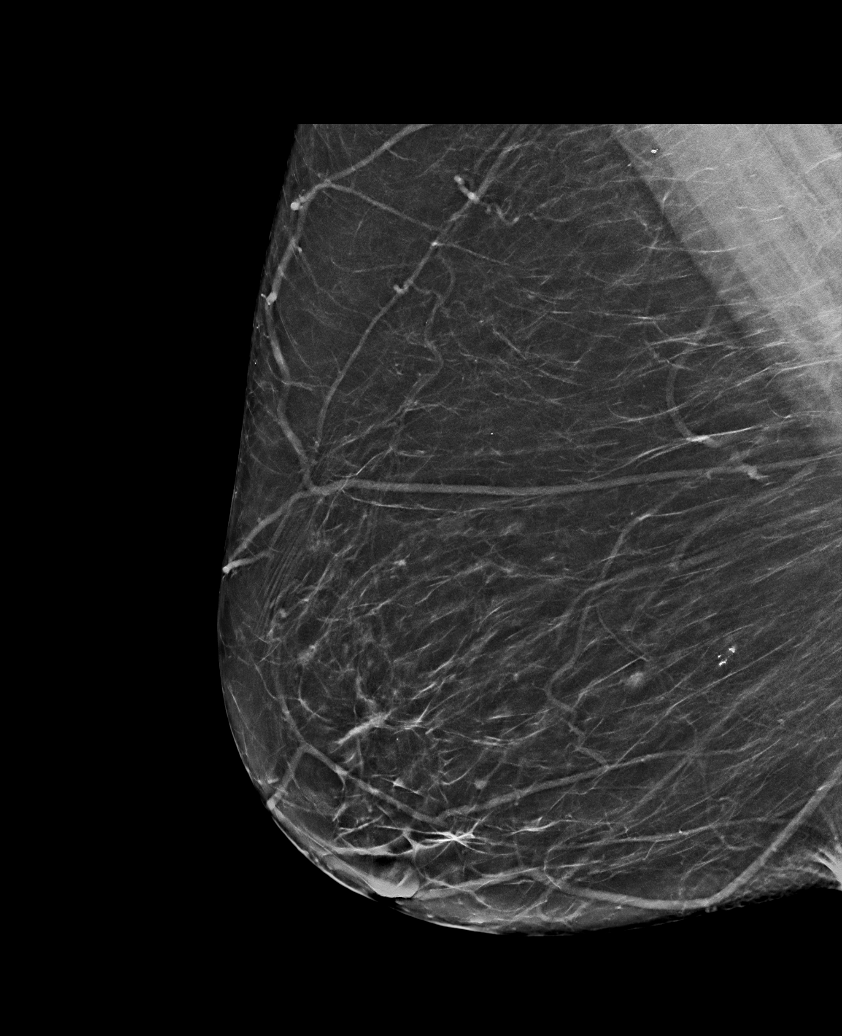

[R CC synth-2D (2 of 2)]
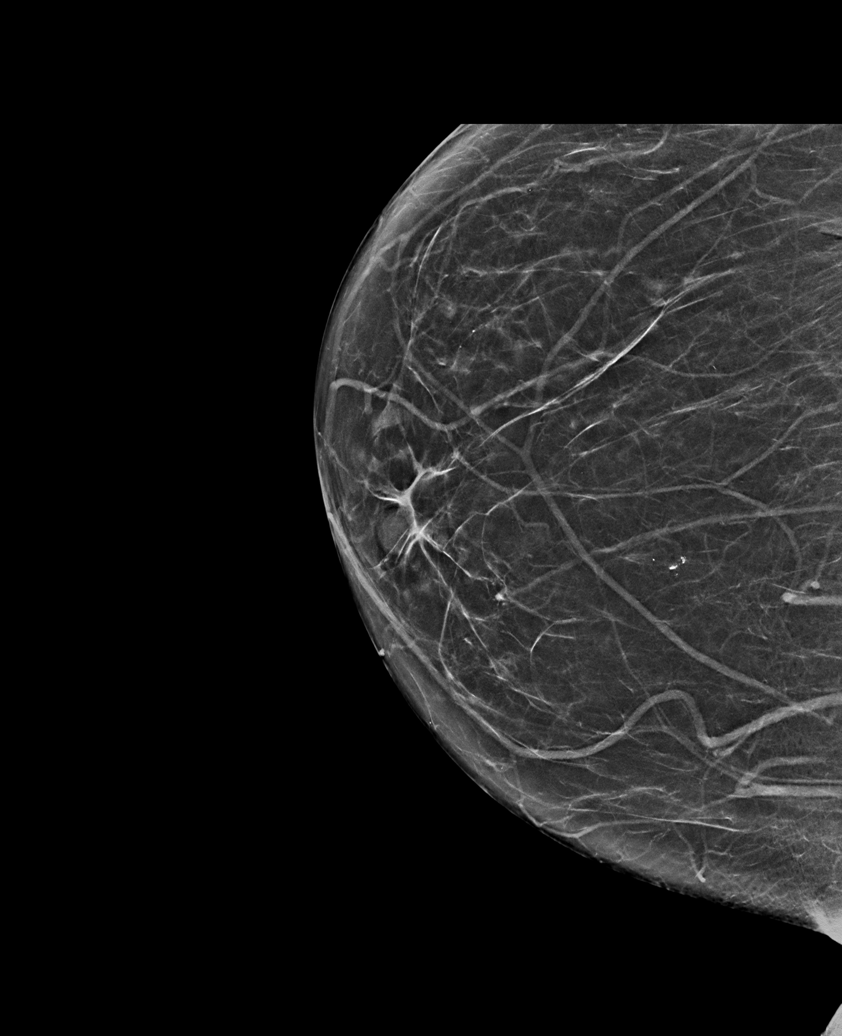

[L CC synth-2D (2 of 2)]
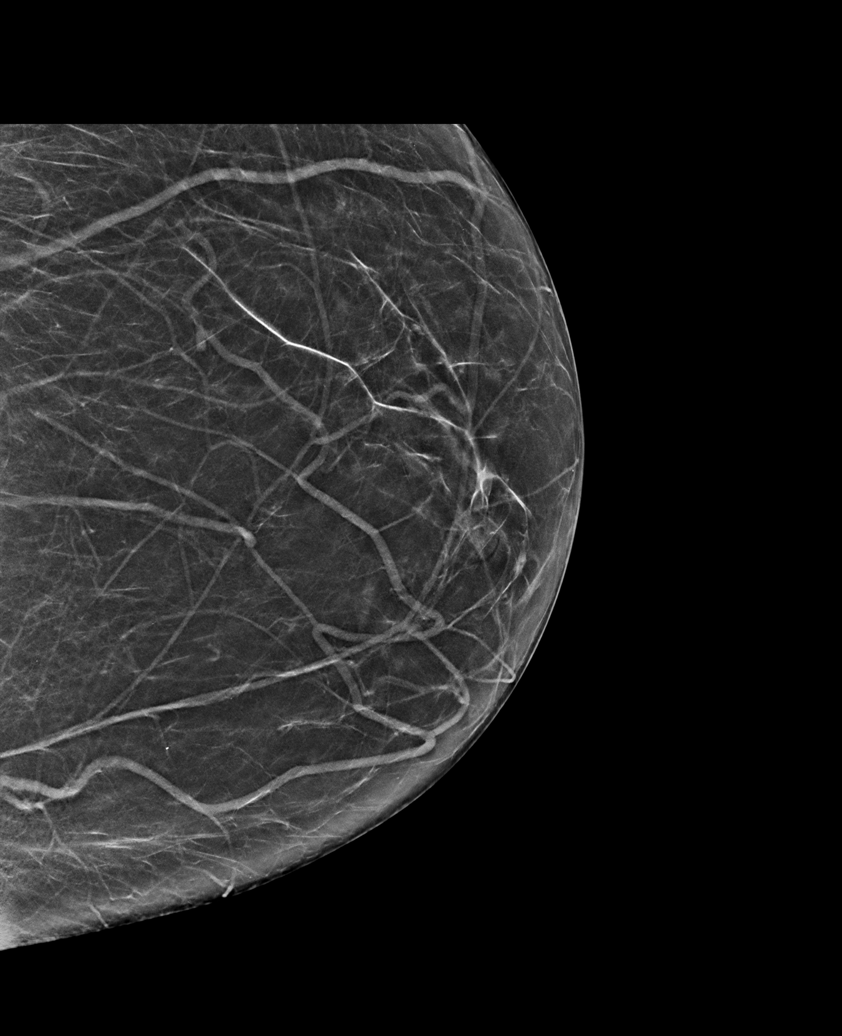

[6 of 36 positions shown; findings below may reference images not displayed]

ACR Breast Density Category b: There are scattered areas of
fibroglandular density.
FINDINGS: There are no findings suspicious for malignancy. Images were
processed with CAD.
IMPRESSION: No mammographic evidence of malignancy. A result letter of this
screening mammogram will be mailed directly to the patient.

RECOMMENDATION:
Screening mammogram in one year. (Code:[id number])

BI-RADS CATEGORY  1: Negative.

## 2021-09-01 ENCOUNTER — Other Ambulatory Visit: Payer: Self-pay | Admitting: Family Medicine

## 2021-09-01 DIAGNOSIS — Z1231 Encounter for screening mammogram for malignant neoplasm of breast: Secondary | ICD-10-CM

## 2023-01-16 ENCOUNTER — Ambulatory Visit
Admission: EM | Admit: 2023-01-16 | Discharge: 2023-01-16 | Disposition: A | Discharge status: Home / Self Care | Payer: BLUE CROSS/BLUE SHIELD | Attending: Emergency Medicine | Admitting: Emergency Medicine

## 2023-01-16 ENCOUNTER — Ambulatory Visit (INDEPENDENT_AMBULATORY_CARE_PROVIDER_SITE_OTHER): Payer: BLUE CROSS/BLUE SHIELD

## 2023-01-16 DIAGNOSIS — Z1152 Encounter for screening for COVID-19: Secondary | ICD-10-CM | POA: Diagnosis not present

## 2023-01-16 DIAGNOSIS — J069 Acute upper respiratory infection, unspecified: Secondary | ICD-10-CM | POA: Insufficient documentation

## 2023-01-16 DIAGNOSIS — R062 Wheezing: Secondary | ICD-10-CM | POA: Insufficient documentation

## 2023-01-16 DIAGNOSIS — M47814 Spondylosis without myelopathy or radiculopathy, thoracic region: Secondary | ICD-10-CM | POA: Insufficient documentation

## 2023-01-16 DIAGNOSIS — R059 Cough, unspecified: Secondary | ICD-10-CM | POA: Insufficient documentation

## 2023-01-16 LAB — RESP PANEL BY RT-PCR (RSV, FLU A&B, COVID)  RVPGX2
Influenza A by PCR: NEGATIVE
Influenza B by PCR: NEGATIVE
Resp Syncytial Virus by PCR: NEGATIVE
SARS Coronavirus 2 by RT PCR: NEGATIVE

## 2023-01-16 MED ORDER — IPRATROPIUM BROMIDE 0.06 % NA SOLN: 2.0000 | Freq: Four times a day (QID) | NASAL | 12 refills | Status: AC

## 2023-01-16 MED ORDER — ALBUTEROL SULFATE (2.5 MG/3ML) 0.083% IN NEBU: 2.5000 mg | INHALATION_SOLUTION | Freq: Four times a day (QID) | RESPIRATORY_TRACT | 12 refills | Status: AC | PRN

## 2023-01-16 MED ORDER — BENZONATATE 100 MG PO CAPS: 200.0000 mg | ORAL_CAPSULE | Freq: Three times a day (TID) | ORAL | 0 refills | Status: AC

## 2023-01-16 NOTE — Discharge Instructions (Signed)
Your testing today was negative for COVID, RSV, and influenza.  Your chest x-ray did not show any evidence of pneumonia.  I do believe you have a viral respiratory infection and this is causing your nasal congestion, nasal drainage, and wheezing.  I do not feel you need an antibiotic at this time.  Use your albuterol nebulizer solution every 4-6 hours as needed for wheezing.  Use the Atrovent nasal spray, 2 squirts up each nostril every 6 hours, as needed for nasal congestion, drainage, and postnasal drip.  Use the Tessalon Perles every 8 hours during the day as needed for cough.  Take them with a small sip of water.  They may give you some numbness to the base of your tongue or metallic taste in her mouth, this is normal.  Use over-the-counter cough preparations as needed for cough and congestion such as a Safe Tussin you have been taking.  If you develop any new or worsening symptoms either return for reevaluation or see your primary care provider.

## 2023-01-16 NOTE — ED Provider Notes (Signed)
[facility]    CSN: [id number] Arrival date & time: 01/16/23  0802      History   Chief Complaint Chief Complaint  Patient presents with   Nasal Congestion    HPI Carol Gallegos is a 47 y.o. female.   HPI  47 year old female here for evaluation of respiratory complaints.  Symptoms began 2 days ago and they consist of nasal congestion with runny nose for green mucus, sneezing, feeling hot and cold with sweats, and a cough that was productive this morning.  She also Dors is that she had start of symptoms but that is resolved.  She has not measured a fever at home and she denies GI symptoms.  She has a significant past medical history to include history of MRSA, HPV, gastric bypass cancer.  Past Medical History:  Diagnosis Date   Cancer [facility]) 1997   uterine   Colon polyp 2009   History of methicillin resistant staphylococcus aureus (MRSA)    HPV in female     There are no problems to display for this patient.   Past Surgical History:  Procedure Laterality Date   ABDOMINAL HYSTERECTOMY  2001   BLADDER SURGERY  1997   BREAST CYST EXCISION Right 07/2016   [place] gland removed   COLONOSCOPY  2009   GASTRIC BYPASS  2007   RECTAL SURGERY  2001    OB History     Gravida  2   Para  2   Term      Preterm      AB      Living         SAB      IAB      Ectopic      Multiple      Live Births           Obstetric Comments  1st Menstrual Cycle:  7  1st Pregnancy:  17           Home Medications    Prior to Admission medications   Medication Sig Start Date End Date Taking? Authorizing Provider  amLODipine (NORVASC) 10 MG tablet Take 10 mg by mouth daily. 11/23/22  Yes [provider]  benzonatate (TESSALON) 100 MG capsule Take 2 capsules (200 mg total) by mouth every 8 (eight) hours. 01/16/23  Yes Margarette Canada, NP  hydrochlorothiazide (HYDRODIURIL) 25 MG tablet Take 25 mg by mouth daily. 11/23/22  Yes [provider]  ipratropium (ATROVENT) 0.06 % nasal spray Place 2 sprays into both nostrils 4 (four) times daily. 01/16/23  Yes Margarette Canada, NP  losartan (COZAAR) 25 MG tablet Take 25 mg by mouth daily. 11/23/22  Yes [provider]  SYMBICORT 160-4.5 MCG/ACT inhaler Inhale into the lungs. 11/23/22  Yes [provider]  albuterol (PROVENTIL HFA;VENTOLIN HFA) 108 (90 Base) MCG/ACT inhaler Inhale 2 puffs into the lungs every 6 (six) hours as needed for wheezing or shortness of breath. 03/05/19   Sable Feil, PA-C  albuterol (PROVENTIL) (2.5 MG/3ML) 0.083% nebulizer solution Take 3 mLs (2.5 mg total) by nebulization every 6 (six) hours as needed for wheezing or shortness of breath. 01/16/23   Margarette Canada, NP  cyclobenzaprine (FLEXERIL) 10 MG tablet Take 1 tablet (10 mg total) by mouth 3 (three) times daily as needed for muscle spasms. 11/07/18   Triplett, Johnette Abraham B, FNP  diclofenac sodium (VOLTAREN) 1 % GEL Apply 4 g topically 4 (four) times daily. 11/07/18   Triplett, Dessa Phi, FNP  lisinopril (  PRINIVIL,ZESTRIL) 10 MG tablet Take 10 mg by mouth daily.    [provider]  predniSONE (STERAPRED UNI-PAK 21 TAB) 10 MG (21) TBPK tablet Take by mouth daily. 03/05/19   Sable Feil, PA-C    Family History Family History  Problem Relation Age of Onset   Diabetes Mother    Lung cancer Father    Colon cancer Maternal Uncle    Breast cancer Neg Hx     Social History Social History   Tobacco Use   Smoking status: Every Day    Packs/day: 0.50    Types: Cigarettes   Smokeless tobacco: Never  Substance Use Topics   Alcohol use: Yes   Drug use: No     Allergies   Biaxin [clarithromycin], Morphine, Motrin [ibuprofen], and Sumatriptan   Review of Systems Review of Systems  Constitutional:  Positive for diaphoresis. Negative for fever.  HENT:  Positive for congestion, ear pain, rhinorrhea, sneezing and sore throat.   Respiratory:  Positive for cough and wheezing.    Gastrointestinal:  Negative for diarrhea, nausea and vomiting.  Skin:  Negative for rash.     Physical Exam Triage Vital Signs ED Triage Vitals  Enc Vitals Group     BP 01/16/23 0829 (!) 165/93     Pulse Rate 01/16/23 0829 89     Resp --      Temp 01/16/23 0829 98.4 F (36.9 C)     Temp Source 01/16/23 0829 Oral     SpO2 01/16/23 0829 100 %     Weight 01/16/23 0827 206 lb (93.4 kg)     Height 01/16/23 0827 '5\' 7"'$  (1.702 m)     Head Circumference --      Peak Flow --      Pain Score 01/16/23 0827 0     Pain Loc --      Pain Edu? --      Excl. in [place]? --    No data found.  Updated Vital Signs BP (!) 165/93 (BP Location: Left Arm)   Pulse 89   Temp 98.4 F (36.9 C) (Oral)   Ht '5\' 7"'$  (1.702 m)   Wt 206 lb (93.4 kg)   SpO2 100%   BMI 32.26 kg/m   Visual Acuity Right Eye Distance:   Left Eye Distance:   Bilateral Distance:    Right Eye Near:   Left Eye Near:    Bilateral Near:     Physical Exam Vitals and nursing note reviewed.  Constitutional:      Appearance: Normal appearance. She is not ill-appearing.  HENT:     Head: Normocephalic and atraumatic.     Right Ear: Tympanic membrane, ear canal and external ear normal. There is no impacted cerumen.     Left Ear: Tympanic membrane, ear canal and external ear normal. There is no impacted cerumen.     Nose: Congestion and rhinorrhea present.     Comments: Nasal mucosa is erythematous and edematous with clear discharge in both nares.    Mouth/Throat:     Mouth: Mucous membranes are moist.     Pharynx: Oropharyngeal exudate and posterior oropharyngeal erythema present.     Comments: Tonsillar pillars are benign.  Patient does have erythema and injection of her posterior oropharynx with clear postnasal drip. Cardiovascular:     Rate and Rhythm: Normal rate and regular rhythm.     Pulses: Normal pulses.     Heart sounds: Normal heart sounds. No murmur heard.  No friction rub. No gallop.  Pulmonary:     Effort:  Pulmonary effort is normal.     Breath sounds: Wheezing present. No rhonchi or rales.     Comments: Patient has diffuse wheezing but no rales or rhonchi. Musculoskeletal:     Cervical back: Normal range of motion and neck supple.  Lymphadenopathy:     Cervical: No cervical adenopathy.  Skin:    General: Skin is warm and dry.     Capillary Refill: Capillary refill takes less than 2 seconds.     Findings: No erythema or rash.  Neurological:     General: No focal deficit present.     Mental Status: She is alert and oriented to person, place, and time.  Psychiatric:        Mood and Affect: Mood normal.        Behavior: Behavior normal.        Thought Content: Thought content normal.        Judgment: Judgment normal.      UC Treatments / Results  Labs (all labs ordered are listed, but only abnormal results are displayed) Labs Reviewed  RESP PANEL BY RT-PCR (RSV, FLU A&B, COVID)  [id number]    EKG   Radiology DG Chest 2 View  Result Date: 01/16/2023 CLINICAL DATA:  cough and wheezing EXAM: CHEST - 2 VIEW COMPARISON:  Radiograph 03/05/2019 FINDINGS: The cardiomediastinal silhouette is within normal limits. There is no focal airspace consolidation. No pleural effusion or evidence of pneumothorax. No acute osseous abnormality. Thoracic spondylosis. IMPRESSION: No evidence of acute cardiopulmonary disease. Electronically Signed   By: Maurine Simmering M.D.   On: 01/16/2023 09:16    Procedures Procedures (including critical care time)  Medications Ordered in UC Medications - No data to display  Initial Impression / Assessment and Plan / UC Course  I have reviewed the triage vital signs and the nursing notes.  Pertinent labs & imaging results that were available during my care of the patient were reviewed by me and considered in my medical decision making (see chart for details).   Patient is a nontoxic-appearing 47 year old female here for evaluation of 2 days worth of respiratory  complaints as outlined HPI above.  She is not in any acute distress and she can speak in full sentences without dyspnea or tachypnea.  She came in for evaluation and she is concerned that she may be infectious.  Inflammation of her upper respiratory tract as evidenced by nasal mucosa inflammation and erythema with clear rhinorrhea and clear postnasal drip.  Cardiopulmonary exam reveals wheezes diffusely.  She does have Proventil inhaler that she states is not helping her symptoms.  She also has a nebulizer machine at home which she does not have any solution.  The patient is a smoker as well.  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  Respiratory panel is negative for COVID, influenza A and B, and RSV.  Radiology read of chest x-ray states that there is no evidence of acute cardiopulmonary disease.  Start patient home with a diagnosis of viral URI with a cough and I will prescribe albuterol nebulizer solution that she can use every 4-6 hours as needed for wheezing.  Tessalon Perles for cough along with Atrovent nasal spray to help with the nasal congestion and postnasal drip.  She can use over-the-counter cough preparations such as Delsym, Robitussin, or Zarbee's as needed for cough and congestion.  Work note provided.   Final Clinical Impressions(s) / UC Diagnoses   Final diagnoses:  Viral URI with cough     Discharge Instructions      Your testing today was negative for COVID, RSV, and influenza.  Your chest x-ray did not show any evidence of pneumonia.  I do believe you have a viral respiratory infection and this is causing your nasal congestion, nasal drainage, and wheezing.  I do not feel you need an antibiotic at this time.  Use your albuterol nebulizer solution every 4-6 hours as needed for wheezing.  Use the  Atrovent nasal spray, 2 squirts up each nostril every 6 hours, as needed for nasal congestion, drainage, and postnasal drip.  Use the Tessalon Perles every 8 hours during the day as needed for cough.  Take them with a small sip of water.  They may give you some numbness to the base of your tongue or metallic taste in her mouth, this is normal.  Use over-the-counter cough preparations as needed for cough and congestion such as a Safe Tussin you have been taking.  If you develop any new or worsening symptoms either return for reevaluation or see your primary care provider.     ED Prescriptions     Medication Sig Dispense Auth. Provider   albuterol (PROVENTIL) (2.5 MG/3ML) 0.083% nebulizer solution Take 3 mLs (2.5 mg total) by nebulization every 6 (six) hours as needed for wheezing or shortness of breath. 75 mL Margarette Canada, NP   benzonatate (TESSALON) 100 MG capsule Take 2 capsules (200 mg total) by mouth every 8 (eight) hours. 21 capsule Margarette Canada, NP   ipratropium (ATROVENT) 0.06 % nasal spray Place 2 sprays into both nostrils 4 (four) times daily. 15 mL Margarette Canada, NP      PDMP not reviewed this encounter.   Margarette Canada, NP 01/16/23 [phone]

## 2023-01-16 NOTE — ED Triage Notes (Signed)
Pt c/o sneezing x day before yesterday, green mucous, congestion, rattling in chest. Pt has been taking safe tussin for congestion

## 2023-02-13 ENCOUNTER — Other Ambulatory Visit: Payer: Self-pay | Admitting: Nurse Practitioner

## 2023-02-13 DIAGNOSIS — Z1231 Encounter for screening mammogram for malignant neoplasm of breast: Secondary | ICD-10-CM

## 2023-06-28 ENCOUNTER — Emergency Department: Payer: BLUE CROSS/BLUE SHIELD

## 2023-06-28 ENCOUNTER — Encounter: Payer: Self-pay | Admitting: Emergency Medicine

## 2023-06-28 ENCOUNTER — Other Ambulatory Visit: Payer: Self-pay

## 2023-06-28 ENCOUNTER — Emergency Department
Admission: EM | Admit: 2023-06-28 | Discharge: 2023-06-28 | Disposition: A | Discharge status: Home / Self Care | Payer: BLUE CROSS/BLUE SHIELD | Attending: Emergency Medicine | Admitting: Emergency Medicine

## 2023-06-28 DIAGNOSIS — R29898 Other symptoms and signs involving the musculoskeletal system: Secondary | ICD-10-CM

## 2023-06-28 DIAGNOSIS — R202 Paresthesia of skin: Secondary | ICD-10-CM | POA: Insufficient documentation

## 2023-06-28 DIAGNOSIS — R531 Weakness: Secondary | ICD-10-CM | POA: Insufficient documentation

## 2023-06-28 DIAGNOSIS — E876 Hypokalemia: Secondary | ICD-10-CM | POA: Diagnosis not present

## 2023-06-28 DIAGNOSIS — M79662 Pain in left lower leg: Secondary | ICD-10-CM | POA: Insufficient documentation

## 2023-06-28 DIAGNOSIS — R739 Hyperglycemia, unspecified: Secondary | ICD-10-CM | POA: Insufficient documentation

## 2023-06-28 LAB — URINALYSIS, ROUTINE W REFLEX MICROSCOPIC
Bilirubin Urine: NEGATIVE
Glucose, UA: NEGATIVE mg/dL
Hgb urine dipstick: NEGATIVE
Ketones, ur: NEGATIVE mg/dL
Leukocytes,Ua: NEGATIVE
Nitrite: NEGATIVE
Protein, ur: NEGATIVE mg/dL
Specific Gravity, Urine: 1.006 (ref 1.005–1.030)
pH: 7 (ref 5.0–8.0)

## 2023-06-28 LAB — CBC
HCT: 39.1 % (ref 36.0–46.0)
Hemoglobin: 12.4 g/dL (ref 12.0–15.0)
MCH: 25.2 pg — ABNORMAL LOW (ref 26.0–34.0)
MCHC: 31.7 g/dL (ref 30.0–36.0)
MCV: 79.5 fL — ABNORMAL LOW (ref 80.0–100.0)
Platelets: 240 10*3/uL (ref 150–400)
RBC: 4.92 MIL/uL (ref 3.87–5.11)
RDW: 16.5 % — ABNORMAL HIGH (ref 11.5–15.5)
WBC: 6.9 10*3/uL (ref 4.0–10.5)
nRBC: 0 % (ref 0.0–0.2)

## 2023-06-28 LAB — BASIC METABOLIC PANEL WITH GFR
Anion gap: 10 (ref 5–15)
BUN: 9 mg/dL (ref 6–20)
CO2: 24 mmol/L (ref 22–32)
Calcium: 9.2 mg/dL (ref 8.9–10.3)
Chloride: 98 mmol/L (ref 98–111)
Creatinine, Ser: 0.54 mg/dL (ref 0.44–1.00)
GFR, Estimated: >60 mL/min
Glucose, Bld: 221 mg/dL — ABNORMAL HIGH (ref 70–99)
Potassium: 3.2 mmol/L — ABNORMAL LOW (ref 3.5–5.1)
Sodium: 132 mmol/L — ABNORMAL LOW (ref 135–145)

## 2023-06-28 MED ORDER — SODIUM CHLORIDE 0.9 % IV BOLUS
1000.0000 mL | Freq: Once | INTRAVENOUS | Status: AC
  Administered 2023-06-28: 1000 mL via INTRAVENOUS

## 2023-06-28 MED ORDER — POTASSIUM CHLORIDE CRYS ER 20 MEQ PO TBCR
40.0000 meq | EXTENDED_RELEASE_TABLET | Freq: Once | ORAL | Status: AC
  Administered 2023-06-28: 40 meq via ORAL
  Filled 2023-06-28: qty 2

## 2023-06-28 NOTE — ED Provider Notes (Signed)
[facility] Provider Note    Event Date/Time   First MD Initiated Contact with Patient 06/28/23 1056     (approximate)   History   Numbness (Right foot)   HPI  Carol Gallegos is a 47 y.o. female presenting to the emergency room for evaluation of leg pain.  Patient reports that she had been having some discomfort and tightness in her left lower extremity.  She went to her primary care doctor and was post to have an outpatient DVT ultrasound, but never had this arranged.  About a week ago she began to notice some decrease sensation isolated to her right foot with some difficulty dorsiflexing her foot.  Able to plantarflex without issue.  She does not have complete numbness, but does report sensation is diminished compared to the contralateral side.  Otherwise denies any numbness, tingling, weakness throughout the remainder of her body.  Remains ambulatory.     Physical Exam   Triage Vital Signs: ED Triage Vitals  Enc Vitals Group     BP 06/28/23 0937 (!) 145/95     Pulse Rate 06/28/23 0937 (!) 110     Resp 06/28/23 0937 18     Temp 06/28/23 0937 98 F (36.7 C)     Temp src --      SpO2 06/28/23 0937 96 %     Weight 06/28/23 0938 196 lb (88.9 kg)     Height 06/28/23 0938 5\' 6"  (1.676 m)     Head Circumference --      Peak Flow --      Pain Score 06/28/23 0938 0     Pain Loc --      Pain Edu? --      Excl. in [place]? --     Most recent vital signs: Vitals:   06/28/23 1135 06/28/23 1200  BP: 122/78 104/82  Pulse: 87 77  Resp:  20  Temp:    SpO2: 97% 94%     General: Awake, interactive  CV:  Regular rate, good peripheral perfusion.  Resp:  Lungs clear, unlabored respirations.  Abd:  Soft, nondistended.  Neuro:  Keenly aware, correctly answers month and age, able to blink eyes and squeeze hands, normal horizontal extraocular movements, no visual field loss, normal facial symmetry, no arm or leg motor drift, no limb ataxia, there is significant  weakness with attempts at dorsiflexion of the right ankle, somewhat difficult to obtain ankle reflexes, but appear symmetric, 5 out of 5 strength with plantarflexion and throughout the remainder of the extremity as well as in the left lower and bilateral upper extremities.  2+ DP pulses bilaterally. No aphasia, no dysarthria, no inattention    ED Results / Procedures / Treatments   Labs (all labs ordered are listed, but only abnormal results are displayed) Labs Reviewed  BASIC METABOLIC PANEL - Abnormal; Notable for the following components:      Result Value   Sodium 132 (*)    Potassium 3.2 (*)    Glucose, Bld 221 (*)    All other components within normal limits  CBC - Abnormal; Notable for the following components:   MCV 79.5 (*)    MCH 25.2 (*)    RDW 16.5 (*)    All other components within normal limits  URINALYSIS, ROUTINE W REFLEX MICROSCOPIC - Abnormal; Notable for the following components:   Color, Urine YELLOW (*)    APPearance CLEAR (*)    All other components within normal limits  EKG EKG independently reviewed interpreted by myself (ER attending) demonstrates:  EKG demonstrates sinus tachycardia at a rate of 108, PR 180, QRS 66, QTc 447, no acute ST changes  RADIOLOGY Imaging independently reviewed and interpreted by myself demonstrates:  MRI of the brain without evidence of stroke Legs without evidence of DVT  PROCEDURES:  Critical Care performed: No  Procedures   MEDICATIONS ORDERED IN ED: Medications  potassium chloride SA (KLOR-CON M) CR tablet 40 mEq (has no administration in time range)  sodium chloride 0.9 % bolus 1,000 mL (1,000 mLs Intravenous New Bag/Given 06/28/23 1143)     IMPRESSION / MDM / ASSESSMENT AND PLAN / ED COURSE  I reviewed the triage vital signs and the nursing notes.  Differential diagnosis includes, but is not limited to, DVT, acute CVA, other central or peripheral nerve pathology, electrolyte abnormality  Patient's  presentation is most consistent with acute presentation with potential threat to life or bodily function.  47 year old female presenting to the emergency department for evaluation of ongoing left leg pain and now right sided foot paresthesias with significant weakness with dorsiflexion on exam.  No other neurodeficits appreciable.  Abnormal presentation for DVT, but ultrasound obtained without evidence of DVT.  MRI brain ordered to evaluate for stroke, fortunately negative.  Labs with mild hypokalemia.  Ordered for oral repletion.  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  No evidence of DKA.  Patient remains ambulatory, do think she is stable for outpatient follow-up.  Patient comfortable this plan.  Strict return precautions provided.      FINAL CLINICAL IMPRESSION(S) / ED DIAGNOSES   Final diagnoses:  Weakness of foot, left  Paresthesia     Rx / DC Orders   ED Discharge Orders     None        Note:  This document was prepared using Dragon voice recognition software and may include unintentional dictation errors.   Trinna Post, MD 06/28/23 [phone]

## 2023-06-28 NOTE — ED Triage Notes (Signed)
Pt states coming in for right foot numbness since last Saturday. Pt Walked into triage. Pt seen by PCP for it with a concern for DVT in the left, but has not gotten the imaging for it

## 2023-06-28 NOTE — Discharge Instructions (Signed)
You were seen in the emergency department today for evaluation of your leg discomfort and weakness of your right foot.  Your exam showed something called foot drop.  Fortunately your MRI did not show signs of a stroke and your ultrasound did not show signs of a blood clot.  Please arrange follow-up with your primary care doctor for further evaluation.  I also included contact information for neurology.  They may consider further testing such as an EMG.  Return to the ER for new or worsening symptoms.

## 2023-06-28 NOTE — ED Notes (Signed)
Pt. To MRI

## 2023-06-28 NOTE — ED Notes (Signed)
This RN to bedside to answer call light, pt. Requests to be unhooked for toilet use. Pt. Unhooked, notifies this RN she is able to walk on her own. Pt. Ambulates to in room toilet without difficulty. NAD. Pt. Verbalizes understanding of hitting call light when she is done so she can be hooked back up.

## 2024-05-05 DIAGNOSIS — Z419 Encounter for procedure for purposes other than remedying health state, unspecified: Secondary | ICD-10-CM | POA: Diagnosis not present

## 2024-06-05 DIAGNOSIS — Z419 Encounter for procedure for purposes other than remedying health state, unspecified: Secondary | ICD-10-CM | POA: Diagnosis not present

## 2024-07-05 DIAGNOSIS — Z419 Encounter for procedure for purposes other than remedying health state, unspecified: Secondary | ICD-10-CM | POA: Diagnosis not present
